# Patient Record
Sex: Female | Born: 1970 | Race: Black or African American | Hispanic: No | Smoking: Current some day smoker
Health system: Southern US, Community
[De-identification: ages and names within clinical notes are randomized; demographics above are authoritative.]

## PROBLEM LIST (undated history)

## (undated) DIAGNOSIS — O24419 Gestational diabetes mellitus in pregnancy, unspecified control: Secondary | ICD-10-CM

## (undated) HISTORY — DX: Gestational diabetes mellitus in pregnancy, unspecified control: O24.419

## (undated) HISTORY — PX: INDUCED ABORTION: SHX677

---

## 1997-06-25 ENCOUNTER — Emergency Department (HOSPITAL_COMMUNITY): Admission: EM | Admit: 1997-06-25 | Discharge: 1997-06-25 | Payer: Self-pay | Admitting: Emergency Medicine

## 1997-07-01 ENCOUNTER — Emergency Department (HOSPITAL_COMMUNITY): Admission: EM | Admit: 1997-07-01 | Discharge: 1997-07-01 | Payer: Self-pay | Admitting: Emergency Medicine

## 1997-10-30 ENCOUNTER — Emergency Department (HOSPITAL_COMMUNITY): Admission: EM | Admit: 1997-10-30 | Discharge: 1997-10-30 | Payer: Self-pay | Admitting: Emergency Medicine

## 1998-05-05 ENCOUNTER — Emergency Department (HOSPITAL_COMMUNITY): Admission: EM | Admit: 1998-05-05 | Discharge: 1998-05-05 | Payer: Self-pay | Admitting: Emergency Medicine

## 1998-05-05 ENCOUNTER — Encounter: Payer: Self-pay | Admitting: Emergency Medicine

## 1998-06-03 ENCOUNTER — Emergency Department (HOSPITAL_COMMUNITY): Admission: EM | Admit: 1998-06-03 | Discharge: 1998-06-03 | Payer: Self-pay | Admitting: Emergency Medicine

## 1998-06-21 ENCOUNTER — Emergency Department (HOSPITAL_COMMUNITY): Admission: EM | Admit: 1998-06-21 | Discharge: 1998-06-22 | Payer: Self-pay

## 2000-04-16 ENCOUNTER — Emergency Department (HOSPITAL_COMMUNITY): Admission: EM | Admit: 2000-04-16 | Discharge: 2000-04-16 | Payer: Self-pay

## 2000-04-28 ENCOUNTER — Emergency Department (HOSPITAL_COMMUNITY): Admission: EM | Admit: 2000-04-28 | Discharge: 2000-04-28 | Payer: Self-pay | Admitting: Emergency Medicine

## 2000-05-16 ENCOUNTER — Emergency Department (HOSPITAL_COMMUNITY): Admission: EM | Admit: 2000-05-16 | Discharge: 2000-05-16 | Payer: Self-pay | Admitting: Emergency Medicine

## 2000-07-17 ENCOUNTER — Emergency Department (HOSPITAL_COMMUNITY): Admission: EM | Admit: 2000-07-17 | Discharge: 2000-07-17 | Payer: Self-pay | Admitting: Emergency Medicine

## 2000-10-15 ENCOUNTER — Inpatient Hospital Stay (HOSPITAL_COMMUNITY): Admission: AD | Admit: 2000-10-15 | Discharge: 2000-10-15 | Payer: Self-pay | Admitting: Obstetrics & Gynecology

## 2000-11-09 ENCOUNTER — Emergency Department (HOSPITAL_COMMUNITY): Admission: EM | Admit: 2000-11-09 | Discharge: 2000-11-09 | Payer: Self-pay | Admitting: *Deleted

## 2000-11-25 ENCOUNTER — Ambulatory Visit (HOSPITAL_COMMUNITY): Admission: RE | Admit: 2000-11-25 | Discharge: 2000-11-25 | Payer: Self-pay | Admitting: *Deleted

## 2000-11-26 ENCOUNTER — Encounter: Admission: RE | Admit: 2000-11-26 | Discharge: 2000-11-26 | Payer: Self-pay | Admitting: Obstetrics

## 2000-12-02 ENCOUNTER — Inpatient Hospital Stay (HOSPITAL_COMMUNITY): Admission: AD | Admit: 2000-12-02 | Discharge: 2000-12-02 | Payer: Self-pay | Admitting: *Deleted

## 2000-12-03 ENCOUNTER — Encounter: Admission: RE | Admit: 2000-12-03 | Discharge: 2000-12-03 | Payer: Self-pay | Admitting: Obstetrics

## 2000-12-10 ENCOUNTER — Inpatient Hospital Stay (HOSPITAL_COMMUNITY): Admission: AD | Admit: 2000-12-10 | Discharge: 2000-12-10 | Payer: Self-pay | Admitting: Obstetrics & Gynecology

## 2000-12-12 ENCOUNTER — Emergency Department (HOSPITAL_COMMUNITY): Admission: EM | Admit: 2000-12-12 | Discharge: 2000-12-12 | Payer: Self-pay | Admitting: *Deleted

## 2001-01-06 ENCOUNTER — Encounter: Admission: RE | Admit: 2001-01-06 | Discharge: 2001-01-06 | Payer: Self-pay | Admitting: Obstetrics & Gynecology

## 2001-01-20 ENCOUNTER — Encounter: Admission: RE | Admit: 2001-01-20 | Discharge: 2001-01-20 | Payer: Self-pay | Admitting: Obstetrics & Gynecology

## 2001-01-21 ENCOUNTER — Encounter: Admission: RE | Admit: 2001-01-21 | Discharge: 2001-01-21 | Payer: Self-pay | Admitting: Obstetrics

## 2001-01-26 ENCOUNTER — Ambulatory Visit (HOSPITAL_COMMUNITY): Admission: RE | Admit: 2001-01-26 | Discharge: 2001-01-26 | Payer: Self-pay | Admitting: Obstetrics

## 2001-01-27 ENCOUNTER — Observation Stay (HOSPITAL_COMMUNITY): Admission: AD | Admit: 2001-01-27 | Discharge: 2001-01-28 | Payer: Self-pay | Admitting: *Deleted

## 2001-01-28 ENCOUNTER — Inpatient Hospital Stay (HOSPITAL_COMMUNITY): Admission: AD | Admit: 2001-01-28 | Discharge: 2001-01-28 | Payer: Self-pay | Admitting: Obstetrics & Gynecology

## 2001-02-03 ENCOUNTER — Encounter: Admission: RE | Admit: 2001-02-03 | Discharge: 2001-02-03 | Payer: Self-pay | Admitting: Obstetrics & Gynecology

## 2001-02-04 ENCOUNTER — Inpatient Hospital Stay (HOSPITAL_COMMUNITY): Admission: AD | Admit: 2001-02-04 | Discharge: 2001-02-04 | Payer: Self-pay | Admitting: Obstetrics

## 2001-02-08 ENCOUNTER — Encounter: Admission: RE | Admit: 2001-02-08 | Discharge: 2001-05-09 | Payer: Self-pay | Admitting: Obstetrics & Gynecology

## 2001-02-10 ENCOUNTER — Encounter (HOSPITAL_COMMUNITY): Admission: RE | Admit: 2001-02-10 | Discharge: 2001-03-12 | Payer: Self-pay | Admitting: Obstetrics & Gynecology

## 2001-02-10 ENCOUNTER — Encounter: Admission: RE | Admit: 2001-02-10 | Discharge: 2001-02-10 | Payer: Self-pay | Admitting: Obstetrics & Gynecology

## 2001-02-16 ENCOUNTER — Inpatient Hospital Stay (HOSPITAL_COMMUNITY): Admission: AD | Admit: 2001-02-16 | Discharge: 2001-02-16 | Payer: Self-pay | Admitting: *Deleted

## 2001-02-18 ENCOUNTER — Ambulatory Visit (HOSPITAL_COMMUNITY): Admission: RE | Admit: 2001-02-18 | Discharge: 2001-02-18 | Payer: Self-pay | Admitting: *Deleted

## 2001-02-18 ENCOUNTER — Encounter: Payer: Self-pay | Admitting: *Deleted

## 2001-02-21 ENCOUNTER — Observation Stay (HOSPITAL_COMMUNITY): Admission: AD | Admit: 2001-02-21 | Discharge: 2001-02-22 | Payer: Self-pay | Admitting: Obstetrics & Gynecology

## 2001-02-24 ENCOUNTER — Encounter: Admission: RE | Admit: 2001-02-24 | Discharge: 2001-02-24 | Payer: Self-pay | Admitting: Obstetrics & Gynecology

## 2001-03-04 ENCOUNTER — Encounter: Admission: RE | Admit: 2001-03-04 | Discharge: 2001-03-04 | Payer: Self-pay | Admitting: Obstetrics

## 2001-03-17 ENCOUNTER — Encounter (HOSPITAL_COMMUNITY): Admission: RE | Admit: 2001-03-17 | Discharge: 2001-03-30 | Payer: Self-pay | Admitting: Obstetrics & Gynecology

## 2001-03-17 ENCOUNTER — Encounter: Admission: RE | Admit: 2001-03-17 | Discharge: 2001-03-17 | Payer: Self-pay | Admitting: Obstetrics & Gynecology

## 2001-03-19 ENCOUNTER — Observation Stay (HOSPITAL_COMMUNITY): Admission: AD | Admit: 2001-03-19 | Discharge: 2001-03-20 | Payer: Self-pay | Admitting: Obstetrics & Gynecology

## 2001-03-20 ENCOUNTER — Encounter: Payer: Self-pay | Admitting: *Deleted

## 2001-03-24 ENCOUNTER — Encounter: Admission: RE | Admit: 2001-03-24 | Discharge: 2001-03-24 | Payer: Self-pay | Admitting: Obstetrics & Gynecology

## 2001-03-29 ENCOUNTER — Inpatient Hospital Stay (HOSPITAL_COMMUNITY): Admission: AD | Admit: 2001-03-29 | Discharge: 2001-04-02 | Payer: Self-pay | Admitting: Obstetrics

## 2001-05-08 ENCOUNTER — Encounter (INDEPENDENT_AMBULATORY_CARE_PROVIDER_SITE_OTHER): Payer: Self-pay | Admitting: *Deleted

## 2001-05-08 LAB — CONVERTED CEMR LAB

## 2001-09-14 ENCOUNTER — Encounter: Admission: RE | Admit: 2001-09-14 | Discharge: 2001-09-14 | Payer: Self-pay | Admitting: Sports Medicine

## 2001-09-21 ENCOUNTER — Encounter: Admission: RE | Admit: 2001-09-21 | Discharge: 2001-09-21 | Payer: Self-pay | Admitting: Family Medicine

## 2001-12-21 ENCOUNTER — Encounter: Admission: RE | Admit: 2001-12-21 | Discharge: 2001-12-21 | Payer: Self-pay | Admitting: Sports Medicine

## 2002-03-21 ENCOUNTER — Encounter: Admission: RE | Admit: 2002-03-21 | Discharge: 2002-03-21 | Payer: Self-pay | Admitting: Family Medicine

## 2002-06-20 ENCOUNTER — Encounter: Admission: RE | Admit: 2002-06-20 | Discharge: 2002-06-20 | Payer: Self-pay | Admitting: Sports Medicine

## 2002-07-16 ENCOUNTER — Emergency Department (HOSPITAL_COMMUNITY): Admission: EM | Admit: 2002-07-16 | Discharge: 2002-07-16 | Payer: Self-pay | Admitting: Emergency Medicine

## 2002-07-28 ENCOUNTER — Emergency Department (HOSPITAL_COMMUNITY): Admission: EM | Admit: 2002-07-28 | Discharge: 2002-07-28 | Payer: Self-pay | Admitting: Emergency Medicine

## 2002-09-19 ENCOUNTER — Encounter: Admission: RE | Admit: 2002-09-19 | Discharge: 2002-09-19 | Payer: Self-pay | Admitting: Family Medicine

## 2002-12-05 ENCOUNTER — Emergency Department (HOSPITAL_COMMUNITY): Admission: EM | Admit: 2002-12-05 | Discharge: 2002-12-05 | Payer: Self-pay | Admitting: Emergency Medicine

## 2002-12-08 ENCOUNTER — Encounter: Admission: RE | Admit: 2002-12-08 | Discharge: 2002-12-08 | Payer: Self-pay | Admitting: Family Medicine

## 2003-01-10 ENCOUNTER — Emergency Department (HOSPITAL_COMMUNITY): Admission: EM | Admit: 2003-01-10 | Discharge: 2003-01-10 | Payer: Self-pay | Admitting: Emergency Medicine

## 2003-01-26 ENCOUNTER — Other Ambulatory Visit: Admission: RE | Admit: 2003-01-26 | Discharge: 2003-01-26 | Payer: Self-pay | Admitting: Family Medicine

## 2003-01-26 ENCOUNTER — Encounter: Admission: RE | Admit: 2003-01-26 | Discharge: 2003-01-26 | Payer: Self-pay | Admitting: Family Medicine

## 2003-02-22 ENCOUNTER — Encounter: Admission: RE | Admit: 2003-02-22 | Discharge: 2003-02-22 | Payer: Self-pay | Admitting: Sports Medicine

## 2003-05-23 ENCOUNTER — Encounter: Admission: RE | Admit: 2003-05-23 | Discharge: 2003-05-23 | Payer: Self-pay | Admitting: Family Medicine

## 2003-05-28 ENCOUNTER — Emergency Department (HOSPITAL_COMMUNITY): Admission: EM | Admit: 2003-05-28 | Discharge: 2003-05-28 | Payer: Self-pay | Admitting: Emergency Medicine

## 2003-06-08 ENCOUNTER — Emergency Department (HOSPITAL_COMMUNITY): Admission: EM | Admit: 2003-06-08 | Discharge: 2003-06-08 | Payer: Self-pay | Admitting: Emergency Medicine

## 2003-07-06 ENCOUNTER — Encounter: Admission: RE | Admit: 2003-07-06 | Discharge: 2003-07-06 | Payer: Self-pay | Admitting: Sports Medicine

## 2003-08-22 ENCOUNTER — Encounter: Admission: RE | Admit: 2003-08-22 | Discharge: 2003-08-22 | Payer: Self-pay | Admitting: Family Medicine

## 2004-04-04 ENCOUNTER — Emergency Department (HOSPITAL_COMMUNITY): Admission: EM | Admit: 2004-04-04 | Discharge: 2004-04-04 | Payer: Self-pay | Admitting: Family Medicine

## 2004-04-09 ENCOUNTER — Ambulatory Visit: Payer: Self-pay | Admitting: Family Medicine

## 2004-05-07 ENCOUNTER — Ambulatory Visit: Payer: Self-pay | Admitting: Sports Medicine

## 2004-05-10 ENCOUNTER — Emergency Department (HOSPITAL_COMMUNITY): Admission: EM | Admit: 2004-05-10 | Discharge: 2004-05-10 | Payer: Self-pay | Admitting: Emergency Medicine

## 2004-06-26 ENCOUNTER — Ambulatory Visit: Payer: Self-pay | Admitting: Family Medicine

## 2004-08-09 ENCOUNTER — Emergency Department (HOSPITAL_COMMUNITY): Admission: EM | Admit: 2004-08-09 | Discharge: 2004-08-09 | Payer: Self-pay | Admitting: Family Medicine

## 2005-07-30 ENCOUNTER — Other Ambulatory Visit: Admission: RE | Admit: 2005-07-30 | Discharge: 2005-07-30 | Payer: Self-pay | Admitting: Family Medicine

## 2005-07-30 ENCOUNTER — Ambulatory Visit: Payer: Self-pay | Admitting: Family Medicine

## 2006-01-06 ENCOUNTER — Ambulatory Visit: Payer: Self-pay | Admitting: Family Medicine

## 2006-05-07 DIAGNOSIS — J45909 Unspecified asthma, uncomplicated: Secondary | ICD-10-CM | POA: Insufficient documentation

## 2006-05-08 ENCOUNTER — Encounter (INDEPENDENT_AMBULATORY_CARE_PROVIDER_SITE_OTHER): Payer: Self-pay | Admitting: *Deleted

## 2006-05-26 ENCOUNTER — Ambulatory Visit: Payer: Self-pay | Admitting: Family Medicine

## 2006-05-26 LAB — CONVERTED CEMR LAB
Beta hcg, urine, semiquantitative: NEGATIVE
Blood in Urine, dipstick: NEGATIVE
Glucose, Urine, Semiquant: NEGATIVE
Nitrite: NEGATIVE
Specific Gravity, Urine: 1.025
Urobilinogen, UA: 1
WBC Urine, dipstick: NEGATIVE
pH: 6

## 2006-06-03 ENCOUNTER — Emergency Department (HOSPITAL_COMMUNITY): Admission: EM | Admit: 2006-06-03 | Discharge: 2006-06-03 | Payer: Self-pay | Admitting: Emergency Medicine

## 2006-06-23 ENCOUNTER — Telehealth: Payer: Self-pay | Admitting: *Deleted

## 2006-07-14 ENCOUNTER — Ambulatory Visit: Payer: Self-pay | Admitting: Family Medicine

## 2006-07-15 ENCOUNTER — Encounter (INDEPENDENT_AMBULATORY_CARE_PROVIDER_SITE_OTHER): Payer: Self-pay | Admitting: *Deleted

## 2006-08-21 ENCOUNTER — Ambulatory Visit: Payer: Self-pay | Admitting: Family Medicine

## 2006-09-30 ENCOUNTER — Encounter (INDEPENDENT_AMBULATORY_CARE_PROVIDER_SITE_OTHER): Payer: Self-pay | Admitting: *Deleted

## 2006-12-10 ENCOUNTER — Encounter: Payer: Self-pay | Admitting: *Deleted

## 2006-12-18 ENCOUNTER — Encounter: Payer: Self-pay | Admitting: Family Medicine

## 2006-12-20 ENCOUNTER — Encounter: Payer: Self-pay | Admitting: Family Medicine

## 2007-02-20 ENCOUNTER — Emergency Department (HOSPITAL_COMMUNITY): Admission: EM | Admit: 2007-02-20 | Discharge: 2007-02-20 | Payer: Self-pay | Admitting: Emergency Medicine

## 2007-04-14 ENCOUNTER — Ambulatory Visit: Payer: Self-pay | Admitting: Family Medicine

## 2007-04-14 ENCOUNTER — Encounter (INDEPENDENT_AMBULATORY_CARE_PROVIDER_SITE_OTHER): Payer: Self-pay | Admitting: Family Medicine

## 2007-04-14 LAB — CONVERTED CEMR LAB
Beta hcg, urine, semiquantitative: NEGATIVE
Bilirubin Urine: NEGATIVE
Glucose, Urine, Semiquant: NEGATIVE
Ketones, urine, test strip: NEGATIVE
Nitrite: NEGATIVE
Protein, U semiquant: NEGATIVE
Specific Gravity, Urine: 1.02
Urobilinogen, UA: 0.2
WBC Urine, dipstick: NEGATIVE
pH: 5.5

## 2007-04-28 ENCOUNTER — Ambulatory Visit: Payer: Self-pay | Admitting: Family Medicine

## 2007-04-28 LAB — CONVERTED CEMR LAB: Beta hcg, urine, semiquantitative: NEGATIVE

## 2007-08-17 ENCOUNTER — Ambulatory Visit: Payer: Self-pay | Admitting: Family Medicine

## 2007-08-17 ENCOUNTER — Telehealth: Payer: Self-pay | Admitting: *Deleted

## 2007-10-18 ENCOUNTER — Emergency Department (HOSPITAL_COMMUNITY): Admission: EM | Admit: 2007-10-18 | Discharge: 2007-10-18 | Payer: Self-pay | Admitting: Emergency Medicine

## 2008-02-02 ENCOUNTER — Telehealth: Payer: Self-pay | Admitting: *Deleted

## 2008-02-09 ENCOUNTER — Ambulatory Visit: Payer: Self-pay | Admitting: Family Medicine

## 2008-02-09 ENCOUNTER — Telehealth: Payer: Self-pay | Admitting: *Deleted

## 2008-05-24 ENCOUNTER — Telehealth (INDEPENDENT_AMBULATORY_CARE_PROVIDER_SITE_OTHER): Payer: Self-pay | Admitting: *Deleted

## 2008-05-31 ENCOUNTER — Other Ambulatory Visit: Admission: RE | Admit: 2008-05-31 | Discharge: 2008-05-31 | Payer: Self-pay | Admitting: Family Medicine

## 2008-05-31 ENCOUNTER — Encounter: Payer: Self-pay | Admitting: Family Medicine

## 2008-05-31 ENCOUNTER — Ambulatory Visit: Payer: Self-pay | Admitting: Family Medicine

## 2008-05-31 LAB — CONVERTED CEMR LAB: Beta hcg, urine, semiquantitative: NEGATIVE

## 2008-06-01 LAB — CONVERTED CEMR LAB: Pap Smear: NORMAL

## 2008-06-02 DIAGNOSIS — R740 Nonspecific elevation of levels of transaminase and lactic acid dehydrogenase [LDH]: Secondary | ICD-10-CM

## 2008-06-02 LAB — CONVERTED CEMR LAB
ALT: 41 units/L — ABNORMAL HIGH (ref 0–35)
AST: 53 units/L — ABNORMAL HIGH (ref 0–37)
Albumin: 4.1 g/dL (ref 3.5–5.2)
Alkaline Phosphatase: 86 units/L (ref 39–117)
BUN: 12 mg/dL (ref 6–23)
CO2: 26 meq/L (ref 19–32)
Calcium: 9.4 mg/dL (ref 8.4–10.5)
Chloride: 106 meq/L (ref 96–112)
Creatinine, Ser: 1.08 mg/dL (ref 0.40–1.20)
Direct LDL: 75 mg/dL
Glucose, Bld: 77 mg/dL (ref 70–99)
Potassium: 4 meq/L (ref 3.5–5.3)
Sodium: 143 meq/L (ref 135–145)
Total Bilirubin: 0.7 mg/dL (ref 0.3–1.2)
Total Protein: 7.4 g/dL (ref 6.0–8.3)

## 2008-06-14 ENCOUNTER — Ambulatory Visit: Payer: Self-pay | Admitting: Family Medicine

## 2008-06-14 LAB — CONVERTED CEMR LAB: Beta hcg, urine, semiquantitative: NEGATIVE

## 2008-09-13 ENCOUNTER — Ambulatory Visit: Payer: Self-pay | Admitting: Family Medicine

## 2008-11-18 ENCOUNTER — Encounter (INDEPENDENT_AMBULATORY_CARE_PROVIDER_SITE_OTHER): Payer: Self-pay | Admitting: *Deleted

## 2008-11-18 DIAGNOSIS — F17200 Nicotine dependence, unspecified, uncomplicated: Secondary | ICD-10-CM

## 2008-12-12 ENCOUNTER — Ambulatory Visit: Payer: Self-pay | Admitting: Family Medicine

## 2009-06-13 ENCOUNTER — Ambulatory Visit: Payer: Self-pay | Admitting: Family Medicine

## 2009-06-13 ENCOUNTER — Telehealth: Payer: Self-pay | Admitting: Family Medicine

## 2009-06-13 ENCOUNTER — Encounter: Payer: Self-pay | Admitting: Family Medicine

## 2009-06-13 LAB — CONVERTED CEMR LAB
Beta hcg, urine, semiquantitative: NEGATIVE
Whiff Test: POSITIVE

## 2009-06-14 ENCOUNTER — Telehealth: Payer: Self-pay | Admitting: Family Medicine

## 2009-06-14 ENCOUNTER — Encounter: Payer: Self-pay | Admitting: Family Medicine

## 2009-06-25 ENCOUNTER — Encounter: Payer: Self-pay | Admitting: Family Medicine

## 2009-06-25 ENCOUNTER — Ambulatory Visit: Payer: Self-pay | Admitting: Family Medicine

## 2009-06-26 ENCOUNTER — Encounter: Payer: Self-pay | Admitting: Family Medicine

## 2009-06-26 LAB — CONVERTED CEMR LAB
ALT: 44 units/L — ABNORMAL HIGH (ref 0–35)
AST: 55 units/L — ABNORMAL HIGH (ref 0–37)
Albumin: 4.2 g/dL (ref 3.5–5.2)
CO2: 25 meq/L (ref 19–32)
Calcium: 9.4 mg/dL (ref 8.4–10.5)
Chloride: 103 meq/L (ref 96–112)
Creatinine, Ser: 1.02 mg/dL (ref 0.40–1.20)
HCT: 44.5 % (ref 36.0–46.0)
Hemoglobin: 14.5 g/dL (ref 12.0–15.0)
MCHC: 32.6 g/dL (ref 30.0–36.0)
MCV: 90.4 fL (ref 78.0–100.0)
Potassium: 4.1 meq/L (ref 3.5–5.3)
RBC: 4.92 M/uL (ref 3.87–5.11)
Total Protein: 7.7 g/dL (ref 6.0–8.3)

## 2009-06-28 ENCOUNTER — Encounter: Payer: Self-pay | Admitting: Family Medicine

## 2009-06-28 ENCOUNTER — Telehealth: Payer: Self-pay | Admitting: Family Medicine

## 2009-06-28 LAB — CONVERTED CEMR LAB: FSH: 7.5 milliintl units/mL

## 2009-07-12 ENCOUNTER — Telehealth: Payer: Self-pay | Admitting: Family Medicine

## 2009-08-03 ENCOUNTER — Encounter: Payer: Self-pay | Admitting: Family Medicine

## 2009-09-05 ENCOUNTER — Telehealth: Payer: Self-pay | Admitting: Family Medicine

## 2009-09-24 ENCOUNTER — Ambulatory Visit: Payer: Self-pay | Admitting: Family Medicine

## 2009-12-09 ENCOUNTER — Emergency Department (HOSPITAL_COMMUNITY): Admission: EM | Admit: 2009-12-09 | Discharge: 2009-12-09 | Payer: Self-pay | Admitting: Emergency Medicine

## 2010-01-16 ENCOUNTER — Encounter: Payer: Self-pay | Admitting: Family Medicine

## 2010-02-07 ENCOUNTER — Telehealth: Payer: Self-pay | Admitting: Family Medicine

## 2010-02-08 ENCOUNTER — Telehealth: Payer: Self-pay | Admitting: *Deleted

## 2010-04-11 NOTE — Progress Notes (Signed)
Summary: needs meds  Phone Note Call from Patient Call back at [phone]   Caller: Patient Summary of Call: is having sleep study on 5/24 and needs something called in to help her sleep. [facility] [address] Initial call taken by: De Nurse,  June 28, 2009 10:02 AM    The sleep study is to evaluate patient's sleep pattern to determine if it is normal.  It is Stege done under normal conditions, without a sleep aid.  This would give [place] most accurate result.  Until then patient can use good sleep hygiene.  Ashley Bibian MD  June 28, 2009 10:21 AM   told her above & gave advice on good sleep hygiene. . normal bedtime is 8:30 to 9:30. gets up at 6. advised going to bed at 9:30. she may be trying to get more than her body needs. told her no murder mysteries before bed. no tv or kids in room. just sex & sleep. cool temp & dark room. she is willing to try this & will let us know after the sleep study how she is doing. Marland KitchenGolden Circle RN  June 28, 2009 10:54 AM  Thank you! Ashley Awbrey MD  June 28, 2009 11:05 AM

## 2010-04-11 NOTE — Progress Notes (Signed)
Summary: Rx Req  Phone Note Refill Request Call back at [phone] Message from:  Patient  Refills Requested: Medication #1:  VALTREX 1 GM TABS 1 tab by mouth two times a day for 10 days [facility] [address]  Initial call taken by: Clydell Hakim,  September 05, 2009 2:38 PM    Was prescribed by Dr Constance Goltz in April.  If pt is having another outbreak, I would like to see her.  Tymira Horkey MD  September 05, 2009 2:45 PM

## 2010-04-11 NOTE — Progress Notes (Signed)
Summary: Lab results  Phone Note Outgoing Call   Call placed by: Ronav Furney MD,  Jul 12, 2009 4:07 PM Call placed to: Patient Summary of Call: Called pt about lab results.  Left message.  Thyroid test a little low, but still ok.  Would like to retest in July.  Pt should reschedule appt in July.   Initial call taken by: Esther Bradstreet MD,  Jul 12, 2009 4:08 PM

## 2010-04-11 NOTE — Letter (Signed)
Summary: Lab Results  [facility]  [address]   [place], [place] [address]   Phone: [phone]  Fax: [phone]    06/14/2009  [facility] [address] [place], [place]  [address]  Dear Ms. Rekowski,  I tried to reach you by phone, but there was no answer.  I have reviewed your lab results from yesterday, and they are as follows:   - Gonorrhea -- negative  - Chlamydia -- negative  - HIV -- negative  - Syphilis -- negative  - Herpes, Type 2 -- positive  The fact that your Herpes, Type 2, antibody test was positive indicates either that you have had a herpes infection in the past or are having one now, I cannot tell from the test.  Either way, your symptoms are significant enough that we should go ahead and treat you.  Enclosed, please find a prescription for Valacyclovir, which is an anti-herpes medicine.  Please take this two times a day for the next 10 days.  Sincerely,   Romero Belling MD  Appended Document: Lab Results Letter and Valacyclovir prescription placed in envelope and placed in outgoing mail.

## 2010-04-11 NOTE — Assessment & Plan Note (Signed)
Summary: vaginal burning   Vital Signs:  Patient profile:   40 year old female Height:      64 inches Weight:      154 pounds BMI:     26.53 Temp:     97.5 degrees F Pulse rate:   85 / minute BP sitting:   130 / 89  Vitals Entered By: Golden Circle RN (June 13, 2009 1:31 PM)  Primary Care Provider:  Cat Ta MD  CC:  vagina burning.  History of Present Illness: 40 yo female with 1 week of burning, tingling (not pain) superficially on the vulva bilateral and above vagina.  Started as pruritic bump on sacrum and spread to pubic hair area (which she shaves) and vulva.  Has bumps in area of pubic hair, none on vulva.  Also with thick vaginal discharge x1 day and suprapubic pain.  One sexual partner x4 years.  Partner without STD sypmtoms, but did have cold sore 2 weeks ago.  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  Denies fever.  Taking no medicines for this.  Is taking Benadryl for allergies.  Requests refills of allergy meds.  Habits & Providers  Alcohol-Tobacco-Diet     Alcohol drinks/day: 1     Tobacco Status: current     Tobacco Counseling: to quit use of tobacco products     Cigarette Packs/Day: <0.25  Exercise-Depression-Behavior     STD Risk: current     Drug Use: past     Seat Belt Use: always  Allergies (verified): 1)  ! Septra  Social History: Packs/Day:  <0.25 Seat Belt Use:  always Drug Use:  past STD Risk:  current  Review of Systems       Per HPI.  Physical Exam  General:  Well-developed,well-nourished,in no acute distress; alert,appropriate and cooperative throughout examination, anxious Abdomen:  Bowel sounds positive,abdomen soft and non-tender without masses, organomegaly or hernias noted. Genitalia:  Normal introitus for age, no external lesions, thick white vaginal discharge, mucosa pink and moist, no vaginal or cervical lesions, no vaginal atrophy, no friaility or hemorrhage, normal uterus size and position, no adnexal  masses or tenderness Skin:  Multiple papules or pustules in pubic hair (shaved), one excorated 3 mm scab on sacrum.   Impression & Recommendations:  Problem # 1:  VAGINAL PRURITUS (ICD-698.1) Assessment New Yeast infection and BV on wet prep:  treat with Floconazole x1, then Metronidazole 500 mg by mouth two times a day x1 week, then repeat Fluconazole x1. 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  Problem # 2:  SUPRAPUBIC PAIN (ICD-789.09) Assessment: New Likely associated with #1, above.  Check UPT--negative.  Complete Medication List: 1)  Albuterol 90 Mcg/act Aers (Albuterol) .... Inhale 2 puff using inhaler every four hours as needed wheeze/shortness of breath 2)  Vistaril 25 Mg Caps (Hydroxyzine pamoate) .... Take 1 capsule by mouth at bedtime for allergic itching 3)  Singulair 10 Mg Tabs (Montelukast sodium) .... One tab by mouth qday 4)  Advair Diskus 250-50 Mcg/dose Misc (Fluticasone-salmeterol) .... Inhaled bid 5)  Flonase 50 Mcg/act Susp (Fluticasone propionate) .... 2 sprays per nostril once daily.  disp qs x1 month 6)  Metronidazole 500 Mg Tabs (Metronidazole) .Marland Kitchen.. 1 tab by mouth two times a day x7 days 7)  Fluconazole 150 Mg Tabs (Fluconazole) .Marland Kitchen.. 1 tab by mouth today, repeat in 1 week  Other Orders: Wet Prep- [facility] (  [address]) GC/Chlamydia-[facility] (87591/87491) HIV-[facility] [phone]) RPR-[facility] [phone]) HSV 2- [facility] (13086-57846) U Preg-[facility] (81025) [facility]- Est Level  3 ([address]295)  Patient Instructions: 1)  Pregnancy test negative. 2)  Yeast infection, bacterial infection of vagina--neither of these are sexually transmitted.  I'll let you know the results of other tests. Prescriptions: FLUCONAZOLE 150 MG TABS (FLUCONAZOLE) 1 tab by mouth today, repeat in 1 week  #2 x 0   Entered and Authorized by:   Romero Belling MD   Signed by:   Romero Belling MD on 06/13/2009   Method used:   Print then  Give to Patient   RxID:   [id number] METRONIDAZOLE 500 MG TABS (METRONIDAZOLE) 1 tab by mouth two times a day x7 days  #14 x 0   Entered and Authorized by:   Romero Belling MD   Signed by:   Romero Belling MD on 06/13/2009   Method used:   Print then Give to Patient   RxID:   [id number] FLONASE 50 MCG/ACT  SUSP (FLUTICASONE PROPIONATE) 2 sprays per nostril once daily.  Disp qs x1 month  #1 x 0   Entered and Authorized by:   Romero Belling MD   Signed by:   Romero Belling MD on 06/13/2009   Method used:   Print then Give to Patient   RxID:   [id number] ADVAIR DISKUS 250-50 MCG/DOSE  MISC (FLUTICASONE-SALMETEROL) inhaled bid  #60 x 0   Entered and Authorized by:   Romero Belling MD   Signed by:   Romero Belling MD on 06/13/2009   Method used:   Print then Give to Patient   RxID:   [id number] VISTARIL 25 MG CAPS (HYDROXYZINE PAMOATE) Take 1 capsule by mouth at bedtime for allergic itching  #30 x 0   Entered and Authorized by:   Romero Belling MD   Signed by:   Romero Belling MD on 06/13/2009   Method used:   Print then Give to Patient   RxID:   [id number] ALBUTEROL 90 MCG/ACT AERS (ALBUTEROL) Inhale 2 puff using inhaler every four hours as needed wheeze/shortness of breath  #1 x 0   Entered and Authorized by:   Romero Belling MD   Signed by:   Romero Belling MD on 06/13/2009   Method used:   Print then Give to Patient   RxID:   [id number]   Laboratory Results   Urine Tests  Date/Time Received: June 13, 2009 2:07 PM  Date/Time Reported: June 13, 2009 2:20 PM     Urine HCG: negative Comments: ...............test performed by......Marland KitchenBonnie A. [place], MLS (ASCP)cm  Date/Time Received: June 13, 2009 2:00 PM  Date/Time Reported: June 13, 2009 2:23 PM   [employer] Source: vag WBC/hpf: 5-10 Bacteria/hpf: 3+  Cocci Clue cells/hpf: many  Positive whiff Yeast/hpf: few Trichomonas/hpf: none Comments: ...............test performed by......Marland KitchenBonnie  A. [place], MLS (ASCP)cm

## 2010-04-11 NOTE — Assessment & Plan Note (Signed)
Summary: depo,df  Nurse Visit   Allergies: 1)  ! Septra  Medication Administration  Injection # 1:    Medication: Depo-Provera 150mg     Diagnosis: CONTRACEPTIVE MANAGEMENT (ICD-V25.09)    Route: IM    Site: LUOQ gluteus    Exp Date: 04/2012    Lot #: [id number]    Mfr: [email or web address]    Comments: next Depo due Oct 3 thru Dec 24, 2009    Patient tolerated injection without complications    Given by: Theresia Lo RN (September 24, 2009 2:22 PM)  Orders Added: 1)  Depo-Provera 150mg  [J1055] 2)  Admin of Injection (IM/SQ) [81191]   Medication Administration  Injection # 1:    Medication: Depo-Provera 150mg     Diagnosis: CONTRACEPTIVE MANAGEMENT (ICD-V25.09)    Route: IM    Site: LUOQ gluteus    Exp Date: 04/2012    Lot #: [id number]    Mfr: [email or web address]    Comments: next Depo due Oct 3 thru Dec 24, 2009    Patient tolerated injection without complications    Given by: Theresia Lo RN (September 24, 2009 2:22 PM)  Orders Added: 1)  Depo-Provera 150mg  [J1055] 2)  Admin of Injection (IM/SQ) [62130]    patient inquired about Rx for Valtrex that she requested a few weeks ago. Explained that Dr. Janalyn Harder had stated that she will need to be seen before and Rx can be given again. offered [place] appointment tomorrow but she cannot come in tomorrow. would like to come Wednesday. she will call back tomorrow to make appoitment for Wednesday.  condoms given at patient's request. Theresia Lo RN  September 24, 2009 2:26 PM

## 2010-04-11 NOTE — Assessment & Plan Note (Signed)
Summary: cpe/pap,tcb   Vital Signs:  Patient profile:   39 year old female Height:      64 inches Weight:      156 pounds BMI:     26.87 Temp:     98.3 degrees F oral Pulse rate:   86 / minute BP sitting:   119 / 83  (right arm) Cuff size:   regular  Vitals Entered By: Tessie Fass CMA (June 25, 2009 2:28 PM) CC: f/u astham Is Patient Diabetic? No Pain Assessment Patient in pain? no        Primary Care Provider:  Faithanne Verret MD  CC:  f/u astham.  History of Present Illness: 40 y/o F here for  Asthma x 4-5 days: nonproductive cough.  Using albuterol inhaler every morning, then 3 more times at night.  +dyspnea after walking uphill to catch the bus.  No fever, chills.  +HA (little bit).  +wheezing.  +nausea.  -vomiting.  Smoking socially when she is around other people.  No one smokes in her house.  Cough is keeping her up.  +Herpes:  Was in sexual relationship with boyfriend for 4 yrs.  She has since broken up with him.  They had plans to marry.  She just picked up Rx for Valtrex, will continue for total 10 days.  Discussed outbreak care.  Family Planning:  on Depo, but has shot was more than 3 months ago.  LMP 04/2009.    Daytime sleepiness:  fall asleep during day if not doing anything.  +snores.  tired during day.  When she wakes up she feels like she did not sleep at all.  Wakes up from sleep because she stops breathing.    FATIGUE Onset: 3-4 months Description: daytime sleepiness, feeling tired all the time.  Fatigue with exertion: sometimes Physical limitations: none   Symptoms Fever: no Night sweats: no Weight loss:  no Exertional chest pain: no Dyspnea: yes Cough: yes Hemoptysis: no New medications: no Leg swelling: no Orthopnea: no PND: no Melena: no Adenopathy: no Severe snoring: yes Daytime sleepiness: yes Skin changes: no Feeling depressed: no Anhedonia: no Altered appetite: no Poor sleep: yes    Habits & Providers  Alcohol-Tobacco-Diet   Tobacco Status: current     Tobacco Counseling: to quit use of tobacco products     Cigarette Packs/Day: <0.25  Current Medications (verified): 1)  Albuterol 90 Mcg/act Aers (Albuterol) .... Inhale 2 Puff Using Inhaler Every Four Hours As Needed Wheeze/shortness of Breath 2)  Vistaril 25 Mg Caps (Hydroxyzine Pamoate) .... Take 1 Capsule By Mouth At Bedtime For Allergic Itching 3)  Advair Diskus 250-50 Mcg/dose  Misc (Fluticasone-Salmeterol) .... Inhaled Bid 4)  Flonase 50 Mcg/act  Susp (Fluticasone Propionate) .... 2 Sprays Per Nostril Once Daily.  Disp Qs X1 Month 5)  Valtrex 1 Gm Tabs (Valacyclovir Hcl) .Marland Kitchen.. 1 Tab By Mouth Two Times A Day For 10 Days 6)  Zyrtec Allergy 10 Mg Caps (Cetirizine Hcl) .Marland Kitchen.. 1 Cap By Mouth Daily 7)  Diphenhydramine Hcl 25 Mg Caps (Diphenhydramine Hcl) .Marland Kitchen.. 1 - 2 Cap By Mouth Two Times A Day As Needed Cough  Allergies (verified): 1)  ! 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 No apparent alopecia or balding. Ears:  External ear exam shows no significant lesions or deformities.  Otoscopic examination reveals clear canals, tympanic membranes are intact bilaterally without bulging, retraction, inflammation or discharge. Hearing is grossly normal bilaterally. Nose:  External nasal examination shows no deformity or inflammation. Nasal mucosa are pink and moist without lesions or exudates. Mouth:  Oral mucosa and oropharynx without lesions or exudates.  Teeth in good repair. Neck:  No deformities, masses, or tenderness noted. Lungs:  Normal respiratory effort, chest expands symmetrically. Lungs are  clear to auscultation, no crackles or wheezes. Heart:  Normal rate and regular rhythm. S1 and S2 normal without gallop, murmur, click, rub or other extra sounds. Abdomen:  Bowel sounds positive,abdomen soft and non-tender without masses, organomegaly or hernias noted. Msk:  No deformity or scoliosis noted of thoracic or lumbar spine.   Pulses:  R and L carotid,radial,l pulses are full and equal bilaterally Extremities:  No clubbing, cyanosis, edema, or deformity noted with normal full range of motion of all joints.   Neurologic:  No cranial nerve deficits noted. Station and gait are normal. Plantar reflexes are down-going bilaterally. DTRs are symmetrical throughout. Sensory, motor and coordinative functions appear intact. Skin:  Intact without suspicious lesions or rashes Cervical Nodes:  No lymphadenopathy noted Axillary Nodes:  No palpable lymphadenopathy   Impression & Recommendations:  Problem # 1:  HYPERSOMNIA (ICD-780.54) Assessment New Complaints may be sleep apnea in nature.  Will refer for sleep study.  Will get TSH, bmet. Orders: CBC-[facility] (13244) Comp Met-[facility] (01027-25366)  Problem # 2:  FATIGUE (ICD-780.79) Assessment: New Related to insomnia?  Will check cbc, cmet, tsh.  Not infectious in nature.   Orders: CBC-[facility] (44034) Comp Met-[facility] (74259-56387) [facility]- Est  Level 4 (56433)  Problem # 3:  CONTRACEPTIVE MANAGEMENT (ICD-V25.09) Assessment: Unchanged If upt negative, will continue depo. Orders: U Preg-[facility] (81025) [facility]- Est  Level 4 (29518) Depo-Provera 150mg  (J1055)  Problem # 4:  ASTHMA, PERSISTENT, MODERATE (ICD-493.90) Assessment: Unchanged Lungs CTA today.  Pt states that medicaid will not pay for singulair and that it would cost her $70.  Will continue other meds.  The following medications were removed from the medication list:    Singulair 10 Mg Tabs (Montelukast sodium) ..... One tab by mouth qday Her updated medication list for this problem includes:     Albuterol 90 Mcg/act Aers (Albuterol) ..... Inhale 2 puff using inhaler every four hours as needed wheeze/shortness of breath    Advair Diskus 250-50 Mcg/dose Misc (Fluticasone-salmeterol) ..... Inhaled bid  Orders: [facility]- Est  Level 4 (84166)  Complete Medication List: 1)  Albuterol 90 Mcg/act Aers (Albuterol) .... Inhale 2 puff using inhaler every four hours as needed wheeze/shortness of breath 2)  Vistaril 25 Mg Caps (Hydroxyzine pamoate) .... Take 1 capsule by mouth at bedtime for allergic itching 3)  Advair Diskus 250-50 Mcg/dose Misc (Fluticasone-salmeterol) .... Inhaled bid 4)  Flonase 50 Mcg/act Susp (Fluticasone propionate) .... 2 sprays per nostril once daily.  disp qs x1 month 5)  Valtrex 1 Gm Tabs (Valacyclovir hcl) .Marland Kitchen.. 1 tab by mouth two times a day for 10 days 6)  Zyrtec Allergy 10 Mg Caps (Cetirizine hcl) .Marland Kitchen.. 1 cap by mouth daily 7)  Diphenhydramine Hcl 25 Mg Caps (Diphenhydramine hcl) .Marland Kitchen.. 1 - 2 cap by mouth two times a day as needed cough  Other Orders: Sleep Disorder Referral (Sleep Disorder) TSH-[facility] (06301-60109) Prescriptions: DIPHENHYDRAMINE HCL 25 MG CAPS (  DIPHENHYDRAMINE HCL) 1 - 2 cap by mouth two times a day as needed cough  #30 x 3   Entered and Authorized by:   Angeline Slim MD   Signed by:   Angeline Slim MD on 06/25/2009   Method used:   Electronically to        [id number] (retail)       [address] [place], [place]  [address]       Ph: [phone]       Fax: [phone]   RxID:   [id number] ZYRTEC ALLERGY 10 MG CAPS (CETIRIZINE HCL) 1 cap by mouth daily  #30 x 6   Entered and Authorized by:   Angeline Slim MD   Signed by:   Angeline Slim MD on 06/25/2009   Method used:   Electronically to        [id number]* (retail)       [address] [place], [place]  [address]       Ph: [phone]       Fax: [phone]   RxID:   [id number]    Prevention & Chronic Care Immunizations   Influenza vaccine: Fluvax Non-MCR   (12/12/2008)   Influenza vaccine due: Refused  (05/31/2008)    Tetanus booster: 05/31/2008: Tdap   Tetanus booster due: 03/10/2008    Pneumococcal vaccine: Not documented  Other Screening   Pap smear: Normal  (06/01/2008)   Pap smear action/deferral: Deferred-3 yr interval  (06/25/2009)   Pap smear due: 06/02/2011   Smoking status: current  (06/25/2009)  Lipids   Total Cholesterol: Not documented   LDL: Not documented   LDL Direct: 75  (05/31/2008)   HDL: Not documented   Triglycerides: Not documented  Laboratory Results   Urine Tests  Date/Time Received: June 25, 2009 3:33 PM  Date/Time Reported: June 25, 2009 3:41 PM     Urine HCG: negative Comments: ...........test performed by...........Marland KitchenTerese Door, CMA      Medication Administration  Injection # 1:    Medication: Depo-Provera 150mg     Diagnosis: CONTRACEPTIVE MANAGEMENT (ICD-V25.09)    Route: IM    Site: RUOQ gluteus    Exp Date: 01/09/2012    Lot #: [id number]    Mfr: [email or web address]    Comments: NEXT DEPO DUE 09-10-09 THROUGH 09-24-09. REMINDER CARD GIVEN TODAY.    Patient tolerated injection without complications    Given by: Arlyss Repress CMA, (June 25, 2009 4:02 PM)  Orders Added: 1)  Sleep Disorder Referral [Sleep Disorder] 2)  TSH-[facility] [32202-54270] 3)  CBC-[facility] [85027] 4)  Comp Met-[facility] [62376-28315] 5)  U Preg-[facility] [81025] 6)  [facility]- Est  Level 4 [17616] 7)  Depo-Provera 150mg  [J1055]

## 2010-04-11 NOTE — Progress Notes (Signed)
Summary: Lab Results  Phone Note Outgoing Call Call back at [facility] Phone [phone]   Call placed by: Romero Belling MD,  June 14, 2009 2:35 PM Call placed to: Patient Summary of Call: I reviewed results from yesterday.  Patient is HSV2 Ab positive, indicating past or present HSV2 infection.  Given symptoms, will initiate treatment with Valacyclovir, though unsure where to send prescription.  Reached voicemail.  Did not give information.  Asked patient to call back at [phone].  When she calls back, will send Rx for Valacyclovir 1000 mg by mouth two times a day x10 days.  Will send patient letter with Rx today as well, in case she doesn't call back.    New/Updated Medications: VALTREX 1 GM TABS (VALACYCLOVIR HCL) 1 tab by mouth two times a day for 10 days Prescriptions: VALTREX 1 GM TABS (VALACYCLOVIR HCL) 1 tab by mouth two times a day for 10 days  #20 x 0   Entered and Authorized by:   Romero Belling MD   Signed by:   Romero Belling MD on 06/14/2009   Method used:   Print then Give to Patient   RxID:   [id number]

## 2010-04-11 NOTE — Miscellaneous (Signed)
Summary: DNKA sleep study  Clinical Lists Changes rec'd fax that pt dnka her sleep study.Golden Circle RN  Aug 03, 2009 11:19 AM

## 2010-04-11 NOTE — Progress Notes (Signed)
Summary: refill  Phone Note Refill Request Message from:  Patient  Refills Requested: Medication #1:  VISTARIL 25 MG CAPS Take 1 capsule by mouth at bedtime for allergic itching Initial call taken by: De Nurse,  February 08, 2010 9:17 AM    I cannot refill this medication.  Last refill was in 06/2009 by another provider.  I have not discussed this with pt. Cat Ta MD  February 08, 2010 9:41 AM  Left message on vm, informing pt of above...............................................Marland KitchenGaren Grams LPN February 08, 2010 1:54 PM

## 2010-04-11 NOTE — Progress Notes (Signed)
Summary: wi request   Phone Note Call from Patient Call back at [phone]   Reason for Call: Talk to Nurse Summary of Call: pt is requesting wi appt this afternoon for std exposure Initial call taken by: Knox Royalty,  June 13, 2009 8:45 AM  Follow-up for Phone Call        feels like she has symptoms. cannot come in until after 12. will see Dr. Constance Goltz at 1:30. she knows she will not be seeing her pcp Follow-up by: Golden Circle RN,  June 13, 2009 8:58 AM

## 2010-04-11 NOTE — Progress Notes (Signed)
  Phone Note Call from Patient   Caller: Patient Call For: [phone] or [phone] Summary of Call: Patient is having an outbreak of her condition.  She need to know if she can get something over the counter until her appt tomorrow.  Please call her to inform her what to do.  Initial call taken by: Abundio Miu,  February 07, 2010 8:57 AM  Follow-up for Phone Call         will ask Dr. Janalyn Harder about this to make sure RX cannot be sent in today without patient being seen. . Follow-up by: Theresia Lo RN,  February 07, 2010 9:53 AM    Prescriptions: VALTREX 1 GM TABS (VALACYCLOVIR HCL) 1 tab by mouth two times a day for 10 days  #20 x 0   Entered and Authorized by:   Angeline Slim MD   Signed by:   Angeline Slim MD on 02/07/2010   Method used:   Electronically to        [id number] [address] (retail)       [address]       [place], [place]  [id number]       Ph: [phone]       Fax: [phone]   RxID:   [id number]  Rx can be sent in.  Pt does not need to come in. Cat Ta MD  February 07, 2010 10:32 AM message left on voicemail that rx has been sent to pharmacy and she does not need to come in.left message to call back if questions.  Appended Document:  meds sent to wrong pharm - needs to go to [facility] [address]

## 2010-04-11 NOTE — Miscellaneous (Signed)
Summary: Changing prob list   Clinical Lists Changes  Problems: Removed problem of FATIGUE (ICD-780.79) Removed problem of HYPERSOMNIA (ICD-780.54) Removed problem of SUPRAPUBIC PAIN (ICD-789.09) Removed problem of VAGINAL PRURITUS (ICD-698.1) Removed problem of NEED PROPHYLACTIC VACCINATION&INOCULATION FLU (ICD-V04.81) Removed problem of RHINITIS, ALLERGIC (ICD-477.9) Removed problem of SCREENING FOR ISCHEMIC HEART DISEASE (ICD-V81.0) Removed problem of AMENORRHEA (ICD-626.0) Removed problem of CONTACT OR EXPOSURE TO OTHER VIRAL DISEASES (ICD-V01.79) Removed problem of SCREENING FOR MALIGNANT NEOPLASM OF THE CERVIX (ICD-V76.2) Removed problem of CONTRACEPTIVE MANAGEMENT (ICD-V25.09) Removed problem of THYROID DISORDER (ICD-246.9)

## 2010-05-22 LAB — POCT PREGNANCY, URINE: Preg Test, Ur: NEGATIVE

## 2010-07-10 ENCOUNTER — Ambulatory Visit (INDEPENDENT_AMBULATORY_CARE_PROVIDER_SITE_OTHER): Payer: Medicaid Other | Admitting: Family Medicine

## 2010-07-10 ENCOUNTER — Encounter: Payer: Self-pay | Admitting: Family Medicine

## 2010-07-10 DIAGNOSIS — J45909 Unspecified asthma, uncomplicated: Secondary | ICD-10-CM

## 2010-07-10 DIAGNOSIS — F17200 Nicotine dependence, unspecified, uncomplicated: Secondary | ICD-10-CM

## 2010-07-10 MED ORDER — HYDROXYZINE PAMOATE 25 MG PO CAPS: ORAL_CAPSULE | ORAL | Status: DC

## 2010-07-10 MED ORDER — FLUTICASONE-SALMETEROL 250-50 MCG/DOSE IN AEPB: 1.0000 | INHALATION_SPRAY | Freq: Two times a day (BID) | RESPIRATORY_TRACT | Status: DC

## 2010-07-10 MED ORDER — ALBUTEROL 90 MCG/ACT IN AERS: 2.0000 | INHALATION_SPRAY | RESPIRATORY_TRACT | Status: DC | PRN

## 2010-07-10 MED ORDER — VALACYCLOVIR HCL 1 G PO TABS: 1000.0000 mg | ORAL_TABLET | Freq: Two times a day (BID) | ORAL | Status: DC

## 2010-07-10 MED ORDER — CETIRIZINE HCL 10 MG PO TABS: 10.0000 mg | ORAL_TABLET | Freq: Every day | ORAL | Status: DC

## 2010-07-10 MED ORDER — FLUTICASONE PROPIONATE 50 MCG/ACT NA SUSP: NASAL | Status: DC

## 2010-07-10 NOTE — Assessment & Plan Note (Signed)
Pt states compliance with Advair, zyrtec, flonase, albuterol but feeling dyspneic after walking up one flight of stairs.  She is having a lot of allergic symptoms too.  I have written to her apt complex to request a ground-floor apt.  I would like pt to f/u in Pharmacy clinic for PFT.  Pt is a nonsmoker.

## 2010-07-10 NOTE — Assessment & Plan Note (Signed)
Was an "occasional" smoker.  Has quit for years.

## 2010-07-10 NOTE — Progress Notes (Signed)
  Subjective:    Patient ID: Ashley Jordan, female    DOB: [birth year], 40 y.o.   MRN: [id number]  HPI Pt is here requesting that I write a letter to her apt complex on her behalf to request that she can be moved to an appt on the ground floor.  She is having difficult walking up a flight of stairs to get to her apt.  She states that she becomes dyspneic.  She is taking her Advair, Zyrtec, Flonase as directed. And she is using albuterol almost daily.  She denies smoking.    Review of Systems  Constitutional: Negative for fever, chills and activity change.  HENT: Positive for rhinorrhea. Negative for congestion, sore throat, mouth sores, trouble swallowing, neck stiffness and sinus pressure.   Respiratory: Positive for shortness of breath and wheezing. Negative for apnea, chest tightness and stridor.   Cardiovascular: Negative for chest pain, palpitations and leg swelling.       Objective:   Physical Exam  Constitutional: She is oriented to person, place, and time. She appears well-developed and well-nourished. No distress.  HENT:  Head: Normocephalic and atraumatic.  Neck: Normal range of motion. Neck supple.  Cardiovascular: Normal rate, regular rhythm, normal heart sounds and intact distal pulses.   No murmur heard. Pulmonary/Chest: Effort normal and breath sounds normal. No respiratory distress. She has no wheezes. She has no rales. She exhibits no tenderness.  Musculoskeletal: She exhibits no edema.  Lymphadenopathy:    She has no cervical adenopathy.  Neurological: She is alert and oriented to person, place, and time.  Skin: She is not diaphoretic.          Assessment & Plan:

## 2010-07-10 NOTE — Patient Instructions (Signed)
Please make an appointment with Pharmacy Clinic for Pulmonary Function Test.  Take all your medications as directed.

## 2010-07-19 ENCOUNTER — Ambulatory Visit: Payer: Medicaid Other | Admitting: Pharmacist

## 2010-07-23 ENCOUNTER — Other Ambulatory Visit: Payer: Self-pay | Admitting: Family Medicine

## 2010-07-23 NOTE — Telephone Encounter (Signed)
Refill request

## 2010-07-24 ENCOUNTER — Ambulatory Visit: Payer: Medicaid Other | Admitting: Family Medicine

## 2010-07-26 NOTE — Discharge Summary (Signed)
[facility]  Patient:    Ashley Jordan, Ashley Jordan Visit Number: [id number] MRN: [id number]          Service Type: OBS Location: 910B 9153 01 Attending Physician:  Tammi Sou Dictated by:   Michell Heinrich, M.D. Admit Date:  01/27/2001 Discharge Date: 01/28/2001                             Discharge Summary  ADMISSION DIAGNOSES:          1. Cervicitis.                               2. Preterm contractions.                               3. Trichomonas vaginalis.  DISCHARGE DIAGNOSES:          1. Cervicitis.                               2. Preterm contractions.                               3. Trichomonas vaginalis.  DISCHARGE MEDICATIONS:        1. Augmentin 500 mg tabs: one tab three times                                  a day for six days.                               2. Actigall 300 mg tabs: one tab twice per day.                               3. Prenatal vitamin one tab daily.  SERVICE:                      OB Teaching Service.  ATTENDING:                    Roseanna Rainbow, M.D.  RESIDENT:                     Michell Heinrich, M.D.  CONSULTS:                     None.  PROCEDURES:                   None.  HISTORY AND PHYSICAL:         For complete H&P, please see resident H&P in chart. Briefly, this was a 40 year old, G3, P0-1-1-0, who presented at [redacted] weeks gestation with complaint of bilateral inguinal crampiness which radiated down into her left leg. Onset was the morning of presentation and was associated with a small amount of pressure. The patient did have a significant past OB history of a delivery of a stillborn 32-week gestation vaginally, and phospholipid tests were all negative and this patient is [facility].  Evaluation on admission revealed  occasional uterine contractions with irritability and a cervix that was fingertip external, closed internal, thick and long,  approximately 3 cm and the baby was high. A urinalysis was negative and a wet prep showed moderate white blood cells and bacteria with few Trichomonas. The patient was afebrile and vital signs were normal. She was admitted for possible cervicitis and preterm contractions and Jordan course as thus follows.  Jordan COURSE:              The patient was admitted to Mother/Baby unit and placed on intermittent toco and external fetal monitoring and placed on IV Unasyn and IV fluids and bed rest. She had improvement in her pain a nondistended the morning after admission, said that she felt much better, and her cervical exam was 1 cm, long and thick and fetal presenting part could not be palpated. Again, she was afebrile and vital signs were normal, and the remainder of her physical exam was normal. Due to her improvement overnight and the determination that her cervical exam could be appropriate for a multiparous patient, she was discharged to home with orders for bed rest and continuation of antibiotics as previously mentioned.  DISCHARGE FOLLOWUP:           She was given preterm labor precautions and followup was to be at [facility] on February 03, 2001 at her usual appointment time. Dictated by:   Michell Heinrich, M.D. Attending Physician:  Tammi Sou DD:  01/28/01 TD:  01/28/01 Job: [phone] [id number]

## 2010-07-26 NOTE — Discharge Summary (Signed)
[facility]  Patient:    Ashley Jordan, Ashley Jordan Visit Number: [id number] MRN: [id number]          Service Type: OBS Location: 910A 9134 01 Attending Physician:  Tammi Sou Dictated by:   Ocie Doyne, M.D. Admit Date:  03/29/2001 Discharge Date: 04/02/2001                             Discharge Summary  ADMISSION DIAGNOSES: 1. Intrauterine pregnancy at 37-1/[redacted] weeks gestation. 2. Preeclampsia. 3. Gestational diabetes mellitus. 4. Cholestasis.  DISCHARGE DIAGNOSES: 1. Spontaneous vaginal delivery at 37-1/[redacted] weeks gestation of a viable female    infant. 2. Preeclampsia. 3. Gestational diabetes mellitus. 4. Cholestasis. 5. Anemia.  DISCHARGE MEDICATIONS: 1. Ibuprofen 600 mg p.o. q.6h. p.r.n. pain. 2. Depo-Provera administered on 04/02/01. 3. Prenatal vitamin one p.o. q.d. x2 months. 4. Ferrous sulfate 325 mg p.o. b.i.d. with meals.  HISTORY OF PRESENT ILLNESS:  This 40 year old, G3, P0-1-1-0, presented at 37-1/[redacted] weeks gestation.  She was seen for a non-stress test which was reassuring, but was noted to have an elevated blood pressure of 140/94, as well as an elevated uric acid of 8.4, and LDH of 188, borderline platelets of 151, and complaining of several days of headache.  PHYSICAL EXAMINATION:  EXTREMITIES:  Deep tendon reflexes 1+, no clonus, and some mild lower extremity edema.  PELVIC:  Her cervical examination was 2 to 3 cm, 75% effacement, and -1 station.  Fetal heart tracing was reassuring.  AST was 28, ALT was 18.  HOSPITAL COURSE:  She wad admitted for induction of labor secondary to preeclampsia, gestational diabetes, and cholestasis.  She received low dose Pitocin, and when she was actively laboring, magnesium sulfate prophylaxis was started.  She also received penicillin during labor because of group B strep positive culture.  During labor, her membranes were artificially ruptured, and she had thin meconium stained  fluid.  She received an epidural for pain, and at 2105 on the date of admission, she delivered a viable female infant by spontaneous vaginal delivery to a sterile field with Apgars of 9 and 9 at one and five minutes.  Placenta delivered spontaneously at 2108.  She was noted to have some bleeding around the gums at delivery.  A stat CBC revealed a platelet count of 159.  She was transferred to the intensive care unit for ongoing magnesium therapy.  Her insulin was discontinued on postpartum day #1, and she received no further treatment for her gestational diabetes which resolved with delivery.  On postpartum day #1, she complained of feeling light-headed and weak.  She was found to have a magnesium level of 6.9, uric acid 7.5, and LDH of 242, with platelets of 151.  Her magnesium dose was titrated down to achieve a therapeutic level, and on postpartum day #3, her magnesium was discontinued.  At that time, her fluid balance showed a net diuresis of 2.5 L over the previous 24 hours.  Her labs had not normalized. She had an uric acid of 7.8, and LDH of 268, but her platelet count was 240. Her blood pressure was within normal limits.  She was diuresing well, and she was felt to be at low risk for complications of preeclampsia.  Her postpartum course was otherwise unremarkable.  At the time of discharge, her hemoglobin was 6.6, this was felt to be due to uterine atony following delivery secondary to use of magnesium sulfate, and also to  chronic anemia.  She will be discharged home on iron supplementation which she will continue over the next two months.  Because of the history of polysubstance abuse and a urine drug screen dated 03/24/01, positive for amphetamines, marijuana, and opiates, social work was involved with this patient during hospitalization.  A report was made to C[facility].  The patient denies current drug use.  Signed a consent for a drug screen for the baby, and plans to return to [facility] following discharge.  She is breast-feeding, and wants Depo-Provera at discharge for contraception.  She is discharged home in stable condition.  FOLLOWUP:  At [facility] six weeks postpartum.  She will bring her daughter, Ashley Jordan, to [facility] for pediatric care. Dictated by:   Ocie Doyne, M.D. Attending Physician:  Tammi Sou DD:  04/02/01 TD:  04/04/01 Job: [id number]

## 2010-08-02 ENCOUNTER — Ambulatory Visit (INDEPENDENT_AMBULATORY_CARE_PROVIDER_SITE_OTHER): Payer: Medicaid Other | Admitting: Pharmacist

## 2010-08-02 VITALS — BP 115/75 | Ht 64.5 in | Wt 163.0 lb

## 2010-08-02 DIAGNOSIS — J4540 Moderate persistent asthma, uncomplicated: Secondary | ICD-10-CM

## 2010-08-02 DIAGNOSIS — J45909 Unspecified asthma, uncomplicated: Secondary | ICD-10-CM

## 2010-08-02 MED ORDER — ALBUTEROL SULFATE (2.5 MG/3ML) 0.083% IN NEBU
2.5000 mg | INHALATION_SOLUTION | Freq: Once | RESPIRATORY_TRACT | Status: AC
  Administered 2010-08-02: 2.5 mg via RESPIRATORY_TRACT

## 2010-08-02 MED ORDER — FLUTICASONE-SALMETEROL 500-50 MCG/DOSE IN AEPB: 1.0000 | INHALATION_SPRAY | Freq: Two times a day (BID) | RESPIRATORY_TRACT | Status: DC

## 2010-08-02 NOTE — Patient Instructions (Signed)
Normal spirometry Finish current Advair inhaler and then pick up higher dose with next prescription.

## 2010-08-02 NOTE — Assessment & Plan Note (Signed)
A: Pt is a 6-8 year asthmatic with severe persistent asthma that is currently uncontrolled. She experiences 5-6 episodes of SOB per day including 1-2 episodes at night. Her FEV1 was 92% predicted and her FEV1/FVC = 0.8. She is currently on Advair 250/50 bid and albuterol. She is compliant with taking Advair regularly however she has been taking her albuterol scheduled with her Advair because she thought this is how she was supposed to take it. She is aware of factors that could worsen her asthma (environmental triggers) and works hard to avoid these. Patient has history of smoking which may have contributed to her asthma at some point however she currently does not smoke. She does also complain of allergies that remain persistent, especially in the spring/summer. She has tried multiple allergy medications (Clariten, Careers adviser) and is currently on Zyrtec and Fluticasone nasal spray daily and benadryl and hydroxyzine at night. She is congested in the clinic today and complains of itching--especially at night.  P: Increase Advair to 500/50 bid and continue albuterol inhaler every 4 hours as needed.  Utilize albuterol inhaler for acute episodes of SOB and not for maintenance use. Continue to avoid environmental triggers that could worsen you asthma. Continue Zyrtec and fluticasone for allergies and consider scheduling a visit with an allergy specialist. If smoking is restarted or frequent use of albuterol is required,  schedule another visit with Dr. Janalyn Harder or the Rx clinic.  Length of office visit ~ 40 minutes Patient seen with Georgina Pillion, PharmD Resident

## 2010-08-02 NOTE — Progress Notes (Signed)
  Subjective:    Patient ID: Ashley Jordan, female    DOB: 1971-02-19, 40 y.o.   MRN: [id number]  HPI Reviewed and agree with Dr. Macky Lower management    Review of Systems     Objective:   Physical Exam        Assessment & Plan:

## 2010-08-02 NOTE — Progress Notes (Signed)
  Subjective:    Patient ID: Ashley Jordan, female    DOB: 1970/10/25, 40 y.o.   MRN: [id number]  HPI  40 y.o who presents to Rx clinic for asthma management and spirometry reading. Her allergy and asthma symptoms presented around 6-8 years ago after the birth of her daughter (who is 86 y.o), she previously had no trouble She gets SOB multiple times a day--walking up 2 flights of stairs to get to her bathroom in her townhouse, walking 2-3 blocks outside on a hot day, and at night. She claims her symptoms are worse in the spring/summer with the hot weather and when the pollen and grass are in full bloom. She still has symptoms in the fall and winter but they are less frequent. She wakes about every 4-5 hours during the night with symptoms of SOB.  She claims her SOB affects her ability to work. She currently has to work from home. Her home has no carpet and she dusts regularly. She avoids sleeping on her pillow if she can. She keeps her house cool and has fans blowing because she feels it helps her breathe better.    Review of Systems     Objective:   Physical Exam        Assessment & Plan:   Asthma A: Pt is a 6-8 year asthmatic with severe persistent asthma that is currently uncontrolled. She experiences 5-6 episodes of SOB per day including 1-2 episodes at night. Her FEV1 was 92% predicted and her FEV1/FVC = 0.8. She is currently on Advair 250/50 bid and albuterol. She is compliant with taking Advair regularly however she has been taking her albuterol scheduled with her Advair because she thought this is how she was supposed to take it. She is aware of factors that could worsen her asthma (environmental triggers) and works hard to avoid these. Patient has history of smoking which may have contributed to her asthma at some point however she currently does not smoke. She does also complain of allergies that remain persistent, especially in the spring/summer. She has tried multiple allergy  medications (Clariten, Careers adviser) and is currently on Zyrtec and Fluticasone nasal spray daily and benadryl and hydroxyzine at night. She is congested in the clinic today and complains of itching--especially at night.  P: Increase Advair to 500/50 bid and continue albuterol inhaler every 4 hours as needed.  Utilize albuterol inhaler for acute episodes of SOB and not for maintenance use. Continue to avoid environmental triggers that could worsen you asthma. Continue Zyrtec and fluticasone for allergies and consider scheduling a visit with an allergy specialist. If smoking is restarted or frequent use of albuterol is required,  schedule another visit with Dr. Janalyn Harder or the Rx clinic.  Length of office visit ~ 40 minutes Patient seen with Georgina Pillion, PharmD Resident

## 2010-08-27 ENCOUNTER — Ambulatory Visit (INDEPENDENT_AMBULATORY_CARE_PROVIDER_SITE_OTHER): Payer: Medicaid Other | Admitting: Family Medicine

## 2010-08-27 ENCOUNTER — Encounter: Payer: Self-pay | Admitting: Family Medicine

## 2010-08-27 VITALS — BP 120/80 | HR 85 | Wt 162.0 lb

## 2010-08-27 DIAGNOSIS — A6000 Herpesviral infection of urogenital system, unspecified: Secondary | ICD-10-CM

## 2010-08-27 DIAGNOSIS — N946 Dysmenorrhea, unspecified: Secondary | ICD-10-CM

## 2010-08-27 MED ORDER — VALACYCLOVIR HCL 1 G PO TABS: 1000.0000 mg | ORAL_TABLET | Freq: Two times a day (BID) | ORAL | Status: DC

## 2010-08-27 MED ORDER — HYDROXYZINE PAMOATE 25 MG PO CAPS: ORAL_CAPSULE | ORAL | Status: DC

## 2010-08-27 NOTE — Progress Notes (Signed)
  Subjective:    Patient ID: Ashley Jordan, female    DOB: [birth year], 40 y.o.   MRN: [id number]  HPI Menstrual pain: She complains of menstrual pain since stopping Depo about 9 months ago.  Usually during her cycle she would hurt for 3-4 days.   Taking Ibuprofen 200mg  every 6 hours (about 3 times per day), which has not given her much relief.  She has also tried Naproxen once a day without relief.  Pain does not prevent her from doing her normal activities. Usually bleeds for 3-4 days, 2 of which is very light.   Sexually active.  Contraception: condoms Discussed IUD (Mirena).  She has one partner for about 4 yrs. No abnormal pap.   LMP: earlier this month.   Review of Systems  Constitutional: Negative for fever, chills and unexpected weight change.  Genitourinary: Negative for frequency, flank pain, vaginal bleeding, vaginal discharge, vaginal pain, pelvic pain and dyspareunia.       Objective:   Physical Exam  Constitutional: She is oriented to person, place, and time. She appears well-developed and well-nourished. No distress.  Neurological: She is alert and oriented to person, place, and time.          Assessment & Plan:

## 2010-08-27 NOTE — Patient Instructions (Addendum)
Please make appt for Pap smear. Please make appt for IUD.  For menstrual pain: Take Ibuprofen 600mg  - 800mg  (3-4 tablets) every 6 hours as needed for pain.

## 2010-08-27 NOTE — Assessment & Plan Note (Addendum)
Pt currently not in pain.  She describes 3-4 days of menstrual cramping during her cycles since she stopped using depo 9 months ago.  She has been taking Ibuprofen 200mg  about 3-4 times per day.  Advised that this dose is not adequate.  Advise that she can take 600mg  to 800mg  every 6 hours.   Gave her condoms today.

## 2010-09-30 ENCOUNTER — Encounter: Payer: Medicaid Other | Admitting: Emergency Medicine

## 2010-12-16 LAB — WET PREP, GENITAL

## 2010-12-16 LAB — POCT URINALYSIS DIP (DEVICE)
Glucose, UA: NEGATIVE
Nitrite: NEGATIVE
Operator id: 270961
Protein, ur: NEGATIVE
Specific Gravity, Urine: 1.025
Urobilinogen, UA: 0.2

## 2010-12-16 LAB — GC/CHLAMYDIA PROBE AMP, GENITAL
Chlamydia, DNA Probe: NEGATIVE
GC Probe Amp, Genital: NEGATIVE

## 2011-01-16 ENCOUNTER — Encounter: Payer: Self-pay | Admitting: Family Medicine

## 2011-01-16 ENCOUNTER — Ambulatory Visit (INDEPENDENT_AMBULATORY_CARE_PROVIDER_SITE_OTHER): Payer: Medicaid Other | Admitting: Family Medicine

## 2011-01-16 ENCOUNTER — Other Ambulatory Visit: Payer: Self-pay | Admitting: Family Medicine

## 2011-01-16 VITALS — BP 127/81 | HR 100 | Temp 98.2°F | Ht 64.0 in | Wt 176.2 lb

## 2011-01-16 DIAGNOSIS — N760 Acute vaginitis: Secondary | ICD-10-CM | POA: Insufficient documentation

## 2011-01-16 DIAGNOSIS — A499 Bacterial infection, unspecified: Secondary | ICD-10-CM

## 2011-01-16 DIAGNOSIS — R350 Frequency of micturition: Secondary | ICD-10-CM | POA: Insufficient documentation

## 2011-01-16 DIAGNOSIS — B9689 Other specified bacterial agents as the cause of diseases classified elsewhere: Secondary | ICD-10-CM

## 2011-01-16 DIAGNOSIS — R300 Dysuria: Secondary | ICD-10-CM

## 2011-01-16 LAB — POCT URINALYSIS DIPSTICK
Ketones, UA: NEGATIVE
Leukocytes, UA: NEGATIVE
Protein, UA: 30
pH, UA: 5.5

## 2011-01-16 LAB — POCT UA - MICROSCOPIC ONLY

## 2011-01-16 MED ORDER — IBUPROFEN 600 MG PO TABS: 600.0000 mg | ORAL_TABLET | Freq: Four times a day (QID) | ORAL | Status: DC | PRN

## 2011-01-16 MED ORDER — METRONIDAZOLE 0.75 % VA GEL: 1.0000 | Freq: Every day | VAGINAL | Status: AC

## 2011-01-16 MED ORDER — CEPHALEXIN 500 MG PO CAPS: 500.0000 mg | ORAL_CAPSULE | Freq: Two times a day (BID) | ORAL | Status: AC

## 2011-01-16 MED ORDER — METRONIDAZOLE 0.75 % VA GEL: 1.0000 | Freq: Every day | VAGINAL | Status: DC

## 2011-01-16 NOTE — Assessment & Plan Note (Signed)
Symptoms suggestive of uncomplicated UTI, will culture urine.  Given red flags to return if symptoms worsen would consider treatment for pyelonephritis.

## 2011-01-16 NOTE — Patient Instructions (Addendum)

## 2011-01-16 NOTE — Progress Notes (Signed)
  Subjective:    Patient ID: Nila Nephew, female    DOB: March 12, 1970, 40 y.o.   MRN: [id number]  HPI 40 yo here for same day appointment  2-3 days history of left side back pain and feeling of swelling.  Notes frequent urination, no dysuria, no vaginal discharge.  She feels she inadequately empties bladder.  Feels some abdominal bloating.  Has been taking azo with some relief.  No fever, chills, nausea, vomiting.  LMP: 12/18/10 only 1 day in duration. Would ike a pregnancy test.  No contraception.   Review of Systems See HPI    Objective:   Physical Exam GEN: Alert & Oriented, No acute distress CV:  Regular Rate & Rhythm, no murmur Respiratory:  Normal work of breathing, CTAB Abd:  + BS, soft, mildly tender in lower pelvis.  Left flank pain          Assessment & Plan:

## 2011-01-16 NOTE — Assessment & Plan Note (Signed)
Clue cells seen on ua.  Not likely cause of symptoms.  She would like to treat,.  rxed metrogel

## 2011-03-18 ENCOUNTER — Other Ambulatory Visit: Payer: Self-pay | Admitting: Emergency Medicine

## 2011-03-18 MED ORDER — IBUPROFEN 600 MG PO TABS: 600.0000 mg | ORAL_TABLET | Freq: Four times a day (QID) | ORAL | Status: DC | PRN

## 2011-05-12 ENCOUNTER — Telehealth: Payer: Self-pay | Admitting: *Deleted

## 2011-05-12 NOTE — Telephone Encounter (Signed)
Approval  received for  Advair. Pharmacy notifed.

## 2011-05-12 NOTE — Telephone Encounter (Signed)
PA required for Advair. Form placed in intern to do box.

## 2011-08-14 ENCOUNTER — Telehealth: Payer: Self-pay | Admitting: Emergency Medicine

## 2011-08-14 NOTE — Telephone Encounter (Signed)
Patient is calling because she needs a new Rx for Vistarill to go to [facility].  Also, she has some questions about getting her Advair.

## 2011-08-15 NOTE — Telephone Encounter (Signed)
Waiting for call back. Please tell pt: Unable to leave messag. Mailbox is full.  Pt needs to call pharmacy for refills and also schedule OV with new PCP. Ashley KitchenArlyss Repress

## 2011-08-26 ENCOUNTER — Other Ambulatory Visit: Payer: Self-pay | Admitting: *Deleted

## 2011-08-26 MED ORDER — HYDROXYZINE PAMOATE 25 MG PO CAPS: ORAL_CAPSULE | ORAL | Status: DC

## 2011-08-27 ENCOUNTER — Emergency Department (HOSPITAL_COMMUNITY)
Admission: EM | Admit: 2011-08-27 | Discharge: 2011-08-27 | Disposition: A | Discharge status: Home / Self Care | Payer: Medicaid Other | Source: Home / Self Care | Attending: Emergency Medicine | Admitting: Emergency Medicine

## 2011-08-27 ENCOUNTER — Encounter (HOSPITAL_COMMUNITY): Payer: Self-pay

## 2011-08-27 ENCOUNTER — Emergency Department (INDEPENDENT_AMBULATORY_CARE_PROVIDER_SITE_OTHER): Payer: Medicaid Other

## 2011-08-27 DIAGNOSIS — S93409A Sprain of unspecified ligament of unspecified ankle, initial encounter: Secondary | ICD-10-CM

## 2011-08-27 MED ORDER — TRAMADOL HCL 50 MG PO TABS: 50.0000 mg | ORAL_TABLET | Freq: Four times a day (QID) | ORAL | Status: DC | PRN

## 2011-08-27 MED ORDER — MELOXICAM 7.5 MG PO TABS: 7.5000 mg | ORAL_TABLET | Freq: Every day | ORAL | Status: DC

## 2011-08-27 MED ORDER — TRAMADOL HCL 50 MG PO TABS: 50.0000 mg | ORAL_TABLET | Freq: Four times a day (QID) | ORAL | Status: AC | PRN

## 2011-08-27 NOTE — ED Notes (Signed)
Pt states she twisted her rt ankle 2 days ago- "tripped inside her back door".  C/o pain and swelling to lateral aspect of rt ankle

## 2011-08-27 NOTE — Discharge Instructions (Signed)
If discomfort persists specially weight-bearing activities after 7-10 days followup with orthopedic Dr. as discussed. Is provided ankle splint for 5-7 days use crutches for 3 days only.

## 2011-08-27 NOTE — ED Provider Notes (Signed)
History     CSN: [id number]  Arrival date & time 08/27/11  1614   First MD Initiated Contact with Patient 08/27/11 1634      Chief Complaint  Patient presents with  . Ankle Pain    (Consider location/radiation/quality/duration/timing/severity/associated sxs/prior treatment) HPI Comments: Patient presents urgent care complaining of ongoing right ankle swelling and pain. She described that she tripped inside her back door and twisted her right ankle. She has been in nursing her ankle in Epsom salts and warm water. Today she woke up in her right ankle is much more swollen. Patient denies any numbness tingling sensation or weakness. It does hurt to walk on it.  Patient is a 41 y.o. female presenting with ankle pain. The history is provided by the patient.  Ankle Pain  The incident occurred more than 2 days ago. The incident occurred at home. The pain is present in the right ankle. The pain is at a severity of 5/10. The pain is moderate. The pain has been constant since onset. Associated symptoms include inability to bear weight. Pertinent negatives include no numbness, no loss of motion, no muscle weakness, no loss of sensation and no tingling. The treatment provided no relief.    Past Medical History  Diagnosis Date  . Gestational diabetes 2003  . PIH (pregnancy induced hypertension) 2003  . Asthma   . Genital herpes     History reviewed. No pertinent past surgical history.  Family History  Problem Relation Age of Onset  . Hypertension Mother   . Diabetes Father     History  Substance Use Topics  . Smoking status: Former Smoker -- 0.3 packs/day    Types: Cigarettes  . Smokeless tobacco: Never Used  . Alcohol Use: Yes    OB History    Grav Para Term Preterm Abortions TAB SAB Ect Mult Living   3 2 1 1 1  1   1       Review of Systems  Constitutional: Negative for fever, chills, activity change, appetite change and fatigue.  Musculoskeletal: Positive for joint swelling  and gait problem.  Skin: Negative for color change, pallor, rash and wound.  Neurological: Negative for tingling, weakness and numbness.    Allergies  Septra  Home Medications   Current Outpatient Rx  Name Route Sig Dispense Refill  . ALBUTEROL 90 MCG/ACT IN AERS Inhalation Inhale 2 puffs into the lungs every 4 (four) hours as needed. For wheeze/shortness of breath 17 g 3  . CETIRIZINE HCL 10 MG PO TABS Oral Take 1 tablet (10 mg total) by mouth daily. 30 tablet 6  . DIPHENHYDRAMINE HCL 25 MG PO CAPS  TAKE 1-2 CAPSULES BY MOUTH TWICE DAILY AS NEEDED FOR COUGH 30 capsule 0  . FLUTICASONE PROPIONATE 50 MCG/ACT NA SUSP  2 sprays per nostril once daily. Disp qs x 1 month 16 g 6  . FLUTICASONE-SALMETEROL 500-50 MCG/DOSE IN AEPB Inhalation Inhale 1 puff into the lungs every 12 (twelve) hours.    Marland Kitchen HYDROXYZINE PAMOATE 25 MG PO CAPS  Take 1 capsule by mouth at bedtime for allergic itching. 30 capsule 3  . VALACYCLOVIR HCL 1 G PO TABS Oral Take 1 tablet (1,000 mg total) by mouth 2 (two) times daily. 20 tablet 3  . FLUTICASONE-SALMETEROL 500-50 MCG/DOSE IN AEPB Inhalation Inhale 1 puff into the lungs 2 (two) times daily. 1 each 11  . MELOXICAM 7.5 MG PO TABS Oral Take 1 tablet (7.5 mg total) by mouth daily. 14 tablet 0  .  TRAMADOL HCL 50 MG PO TABS Oral Take 1 tablet (50 mg total) by mouth every 6 (six) hours as needed for pain. 15 tablet 0  . VALACYCLOVIR HCL 1 G PO TABS Oral Take 1 tablet (1,000 mg total) by mouth 2 (two) times daily. 1 tab by mouth two times a day for 10 days 20 tablet 0    BP 143/89  Pulse 76  Temp 98 F (36.7 C) (Oral)  Resp 20  SpO2 98%  LMP 08/05/2011  Physical Exam  Nursing note and vitals reviewed. Constitutional: She appears well-developed and well-nourished.  Musculoskeletal: She exhibits tenderness.       Right ankle: She exhibits decreased range of motion and swelling. She exhibits no ecchymosis, no deformity, no laceration and normal pulse. tenderness. Lateral  malleolus tenderness found. No medial malleolus, no AITFL, no CF ligament, no head of 5th metatarsal and no proximal fibula tenderness found.       Feet:  Neurological: She is alert.  Skin: No rash noted. No erythema. No pallor.    ED Course  Procedures (including critical care time)  Labs Reviewed - No data to display Dg Ankle Complete Right  08/27/2011  *RADIOLOGY REPORT*  Clinical Data: Fall.  Ankle injury, pain, and swelling.  RIGHT ANKLE - COMPLETE 3+ VIEW  Comparison: None.  Findings: Moderate soft tissue swelling is seen overlying the lateral malleolus.  No evidence of fracture or dislocation.  No other significant bone abnormality identified.  No definite evidence of ankle joint effusion.  IMPRESSION: Lateral soft tissue swelling.  No evidence of fracture.  Original Report Authenticated By: Danae Orleans, M.D.     1. Ankle sprain and strain       MDM   Right ankle sprain and strain. No neural vascular deficits. Patient was put on crutches and an ankle air splint. Encouraged followup with orthopedic Dr. if no improvement or weightbearing pain after 7-10 days. Patient understood instructions and agrees with current treatment and followup as necessary.       Jimmie Molly, MD 08/27/11 2031

## 2011-11-28 ENCOUNTER — Encounter (HOSPITAL_COMMUNITY): Payer: Self-pay

## 2011-11-28 ENCOUNTER — Emergency Department (HOSPITAL_COMMUNITY)
Admission: EM | Admit: 2011-11-28 | Discharge: 2011-11-28 | Disposition: A | Discharge status: Home / Self Care | Payer: Medicaid Other | Source: Home / Self Care

## 2011-11-28 ENCOUNTER — Telehealth: Payer: Self-pay | Admitting: Emergency Medicine

## 2011-11-28 DIAGNOSIS — R1031 Right lower quadrant pain: Secondary | ICD-10-CM

## 2011-11-28 DIAGNOSIS — R52 Pain, unspecified: Secondary | ICD-10-CM

## 2011-11-28 DIAGNOSIS — N390 Urinary tract infection, site not specified: Secondary | ICD-10-CM

## 2011-11-28 LAB — CBC WITH DIFFERENTIAL/PLATELET
Basophils Absolute: 0 10*3/uL (ref 0.0–0.1)
Basophils Relative: 0 % (ref 0–1)
Hemoglobin: 13.6 g/dL (ref 12.0–15.0)
MCHC: 33.2 g/dL (ref 30.0–36.0)
Monocytes Relative: 7 % (ref 3–12)
Neutro Abs: 1.6 10*3/uL — ABNORMAL LOW (ref 1.7–7.7)
Neutrophils Relative %: 26 % — ABNORMAL LOW (ref 43–77)
Platelets: 251 10*3/uL (ref 150–400)
RDW: 13.7 % (ref 11.5–15.5)

## 2011-11-28 LAB — POCT URINALYSIS DIP (DEVICE)
Ketones, ur: NEGATIVE mg/dL
Leukocytes, UA: NEGATIVE
Nitrite: NEGATIVE
Protein, ur: 30 mg/dL — AB

## 2011-11-28 LAB — POCT PREGNANCY, URINE: Preg Test, Ur: NEGATIVE

## 2011-11-28 MED ORDER — CEPHALEXIN 500 MG PO CAPS: 500.0000 mg | ORAL_CAPSULE | Freq: Three times a day (TID) | ORAL | Status: DC

## 2011-11-28 NOTE — ED Notes (Signed)
C/o lower abdominal area pain and lower back pain; concern for UTI or kidney infection; denies concern for STD today

## 2011-11-28 NOTE — Telephone Encounter (Signed)
Pt asking to be worked in, thinks she has a kidney or bladder infection

## 2011-11-28 NOTE — ED Provider Notes (Signed)
Medical screening examination/treatment/procedure(s) were performed by non-physician practitioner and as supervising physician I was immediately available for consultation/collaboration.  Maiana Hennigan   Demareon Coldwell, MD 11/28/11 1717 

## 2011-11-28 NOTE — ED Provider Notes (Signed)
History     CSN: [id number]  Arrival date & time 11/28/11  1437   None     Chief Complaint  Patient presents with  . Urinary Tract Infection    (Consider location/radiation/quality/duration/timing/severity/associated sxs/prior treatment) HPI Comments: 41 year old patient presents with a complaint UTI. She is experiencing pain in her  low back and suprapubis. She complains of urinary frequency, urgency and low voiding volumes. She also has fatigue and sleepiness. The symptoms began approximately 3 days ago. She denies fever or upper respiratory symptoms.   Past Medical History  Diagnosis Date  . Gestational diabetes 2003  . PIH (pregnancy induced hypertension) 2003  . Asthma   . Genital herpes     History reviewed. No pertinent past surgical history.  Family History  Problem Relation Age of Onset  . Hypertension Mother   . Diabetes Father     History  Substance Use Topics  . Smoking status: Former Smoker -- 0.3 packs/day    Types: Cigarettes  . Smokeless tobacco: Never Used  . Alcohol Use: Yes    OB History    Grav Para Term Preterm Abortions TAB SAB Ect Mult Living   3 2 1 1 1  1   1       Review of Systems  Constitutional: Positive for activity change. Negative for fever and diaphoresis.  HENT: Negative.   Respiratory: Negative.   Cardiovascular: Negative.   Gastrointestinal: Negative.   Genitourinary:       As per HPI  Musculoskeletal: Negative for arthralgias and gait problem.  Neurological: Negative.     Allergies  Septra  Home Medications   Current Outpatient Rx  Name Route Sig Dispense Refill  . ALBUTEROL 90 MCG/ACT IN AERS Inhalation Inhale 2 puffs into the lungs every 4 (four) hours as needed. For wheeze/shortness of breath 17 g 3  . CETIRIZINE HCL 10 MG PO TABS Oral Take 1 tablet (10 mg total) by mouth daily. 30 tablet 6  . DIPHENHYDRAMINE HCL 25 MG PO CAPS  TAKE 1-2 CAPSULES BY MOUTH TWICE DAILY AS NEEDED FOR COUGH 30 capsule 0  .  CEPHALEXIN 500 MG PO CAPS Oral Take 1 capsule (500 mg total) by mouth 3 (three) times daily. 21 capsule 0  . FLUTICASONE PROPIONATE 50 MCG/ACT NA SUSP  2 sprays per nostril once daily. Disp qs x 1 month 16 g 6  . FLUTICASONE-SALMETEROL 500-50 MCG/DOSE IN AEPB Inhalation Inhale 1 puff into the lungs 2 (two) times daily. 1 each 11  . FLUTICASONE-SALMETEROL 500-50 MCG/DOSE IN AEPB Inhalation Inhale 1 puff into the lungs every 12 (twelve) hours.    Marland Kitchen HYDROXYZINE PAMOATE 25 MG PO CAPS  Take 1 capsule by mouth at bedtime for allergic itching. 30 capsule 3  . MELOXICAM 7.5 MG PO TABS Oral Take 1 tablet (7.5 mg total) by mouth daily. 14 tablet 0  . VALACYCLOVIR HCL 1 G PO TABS Oral Take 1 tablet (1,000 mg total) by mouth 2 (two) times daily. 20 tablet 3  . VALACYCLOVIR HCL 1 G PO TABS Oral Take 1 tablet (1,000 mg total) by mouth 2 (two) times daily. 1 tab by mouth two times a day for 10 days 20 tablet 0    BP 130/74  Pulse 78  Temp 98.2 F (36.8 C) (Oral)  Resp 16  SpO2 96%  LMP 11/09/2011  Physical Exam  Constitutional: She is oriented to person, place, and time. She appears well-developed and well-nourished. No distress.  Neck: Normal range of motion. Neck  supple.  Cardiovascular: Normal rate and normal heart sounds.   Pulmonary/Chest: Effort normal and breath sounds normal.  Abdominal: Soft.       Tenderness over the suprapubic epigastrium. Complains of mild tenderness in the right lower quadrant no rebound or guarding. Abdomen is soft, bowel sounds normal active  Musculoskeletal: Normal range of motion. She exhibits no edema.  Lymphadenopathy:    She has no cervical adenopathy.  Neurological: She is alert and oriented to person, place, and time.  Skin: Skin is warm and dry.    ED Course  Procedures (including critical care time)  Labs Reviewed  POCT URINALYSIS DIP (DEVICE) - Abnormal; Notable for the following:    Bilirubin Urine SMALL (*)     Hgb urine dipstick LARGE (*)      Protein, ur 30 (*)     All other components within normal limits  CBC WITH DIFFERENTIAL - Abnormal; Notable for the following:    Neutrophils Relative 26 (*)     Neutro Abs 1.6 (*)     Lymphocytes Relative 58 (*)     Eosinophils Relative 9 (*)     All other components within normal limits  POCT PREGNANCY, URINE   No results found.   1. UTI (lower urinary tract infection)   2. Abdominal pain, acute, right lower quadrant       MDM   Results for orders placed during the hospital encounter of 11/28/11  POCT URINALYSIS DIP (DEVICE)      Component Value Range   Glucose, UA NEGATIVE  NEGATIVE mg/dL   Bilirubin Urine SMALL (*) NEGATIVE   Ketones, ur NEGATIVE  NEGATIVE mg/dL   Specific Gravity, Urine >=1.030  1.005 - 1.030   Hgb urine dipstick LARGE (*) NEGATIVE   pH 5.5  5.0 - 8.0   Protein, ur 30 (*) NEGATIVE mg/dL   Urobilinogen, UA 0.2  0.0 - 1.0 mg/dL   Nitrite NEGATIVE  NEGATIVE   Leukocytes, UA NEGATIVE  NEGATIVE  POCT PREGNANCY, URINE      Component Value Range   Preg Test, Ur NEGATIVE  NEGATIVE  CBC WITH DIFFERENTIAL      Component Value Range   WBC 6.1  4.0 - 10.5 K/uL   RBC 4.54  3.87 - 5.11 MIL/uL   Hemoglobin 13.6  12.0 - 15.0 g/dL   HCT 82.9  56.2 - 13.0 %   MCV 90.3  78.0 - 100.0 fL   MCH 30.0  26.0 - 34.0 pg   MCHC 33.2  30.0 - 36.0 g/dL   RDW 86.5  78.4 - 69.6 %   Platelets 251  150 - 400 K/uL   Neutrophils Relative 26 (*) 43 - 77 %   Neutro Abs 1.6 (*) 1.7 - 7.7 K/uL   Lymphocytes Relative 58 (*) 12 - 46 %   Lymphs Abs 3.5  0.7 - 4.0 K/uL   Monocytes Relative 7  3 - 12 %   Monocytes Absolute 0.4  0.1 - 1.0 K/uL   Eosinophils Relative 9 (*) 0 - 5 %   Eosinophils Absolute 0.6  0.0 - 0.7 K/uL   Basophils Relative 0  0 - 1 %   Basophils Absolute 0.0  0.0 - 0.1 K/uL   Keflex 500 tid for 8 d Plenty of fluids.  For worsening, new sx's, problems return or go to ED or see your PCP        Hayden Rasmussen, NP 11/28/11 1658

## 2011-11-28 NOTE — Telephone Encounter (Signed)
Spoke with patient and explained that our scheduled is completely booked today and advised her to go to Urgent Care . She voices understanding.

## 2011-12-18 ENCOUNTER — Encounter (HOSPITAL_COMMUNITY): Payer: Self-pay | Admitting: Emergency Medicine

## 2011-12-18 ENCOUNTER — Emergency Department (HOSPITAL_COMMUNITY)
Admission: EM | Admit: 2011-12-18 | Discharge: 2011-12-18 | Disposition: A | Discharge status: Home / Self Care | Payer: Medicaid Other | Attending: Emergency Medicine | Admitting: Emergency Medicine

## 2011-12-18 ENCOUNTER — Emergency Department (HOSPITAL_COMMUNITY): Payer: Medicaid Other

## 2011-12-18 DIAGNOSIS — Z888 Allergy status to other drugs, medicaments and biological substances status: Secondary | ICD-10-CM | POA: Insufficient documentation

## 2011-12-18 DIAGNOSIS — Z833 Family history of diabetes mellitus: Secondary | ICD-10-CM | POA: Insufficient documentation

## 2011-12-18 DIAGNOSIS — W010XXA Fall on same level from slipping, tripping and stumbling without subsequent striking against object, initial encounter: Secondary | ICD-10-CM | POA: Insufficient documentation

## 2011-12-18 DIAGNOSIS — Z87891 Personal history of nicotine dependence: Secondary | ICD-10-CM | POA: Insufficient documentation

## 2011-12-18 DIAGNOSIS — J45909 Unspecified asthma, uncomplicated: Secondary | ICD-10-CM | POA: Insufficient documentation

## 2011-12-18 DIAGNOSIS — M5416 Radiculopathy, lumbar region: Secondary | ICD-10-CM

## 2011-12-18 DIAGNOSIS — E119 Type 2 diabetes mellitus without complications: Secondary | ICD-10-CM | POA: Insufficient documentation

## 2011-12-18 DIAGNOSIS — IMO0002 Reserved for concepts with insufficient information to code with codable children: Secondary | ICD-10-CM | POA: Insufficient documentation

## 2011-12-18 DIAGNOSIS — Z8249 Family history of ischemic heart disease and other diseases of the circulatory system: Secondary | ICD-10-CM | POA: Insufficient documentation

## 2011-12-18 MED ORDER — OXYCODONE-ACETAMINOPHEN 5-325 MG PO TABS
1.0000 | ORAL_TABLET | Freq: Once | ORAL | Status: AC
  Administered 2011-12-18: 1 via ORAL
  Filled 2011-12-18: qty 1

## 2011-12-18 MED ORDER — OXYCODONE-ACETAMINOPHEN 5-325 MG PO TABS: 1.0000 | ORAL_TABLET | ORAL | Status: DC | PRN

## 2011-12-18 MED ORDER — NAPROXEN 500 MG PO TABS: 500.0000 mg | ORAL_TABLET | Freq: Two times a day (BID) | ORAL | Status: DC

## 2011-12-18 MED ORDER — PREDNISONE 10 MG PO TABS: ORAL_TABLET | ORAL | Status: DC

## 2011-12-18 MED ORDER — HYDROCODONE-ACETAMINOPHEN 5-500 MG PO TABS: 1.0000 | ORAL_TABLET | Freq: Four times a day (QID) | ORAL | Status: DC | PRN

## 2011-12-18 NOTE — ED Notes (Signed)
Pt verbalizes understanding 

## 2011-12-18 NOTE — ED Notes (Signed)
Pt presenting to ed with c/o falling on cement ground and landing on her back pt states pain is in her lower back and radiating into her legs pt states ground was wet and she slipped

## 2011-12-18 NOTE — ED Provider Notes (Signed)
History     CSN: [id number]  Arrival date & time 12/18/11  1404   First MD Initiated Contact with Patient 12/18/11 1608      Chief Complaint  Patient presents with  . Fall  . Back Pain    (Consider location/radiation/quality/duration/timing/severity/associated sxs/prior treatment) HPI Comments: Ashley Jordan is a 41 y.o. Female who presents with complaint of a fall. Pt states she was walking out of class outside, where it was raining, and slipped falling onto the back. States pain in lower back and tail bone. States pain radiating down both legs. Denies numbness or weakness in legs. Denies abdominal pain. No problems using bathroom. No other complaints.     Past Medical History  Diagnosis Date  . PIH (pregnancy induced hypertension) 2003  . Asthma   . Genital herpes   . Diabetes mellitus without complication     History reviewed. No pertinent past surgical history.  Family History  Problem Relation Age of Onset  . Hypertension Mother   . Diabetes Father     History  Substance Use Topics  . Smoking status: Former Smoker -- 0.3 packs/day    Types: Cigarettes  . Smokeless tobacco: Never Used  . Alcohol Use: Yes     social    OB History    Grav Para Term Preterm Abortions TAB SAB Ect Mult Living   3 2 1 1 1  1   1       Review of Systems  Constitutional: Negative for fever and chills.  HENT: Negative for neck pain and neck stiffness.   Respiratory: Negative.   Cardiovascular: Negative.   Gastrointestinal: Negative.   Genitourinary: Negative for dysuria and flank pain.  Musculoskeletal: Positive for back pain and gait problem.  Skin: Negative.   Neurological: Negative for weakness, numbness and headaches.  Hematological: Negative.     Allergies  Septra  Home Medications   Current Outpatient Rx  Name Route Sig Dispense Refill  . ALBUTEROL 90 MCG/ACT IN AERS Inhalation Inhale 2 puffs into the lungs every 4 (four) hours as needed. For wheeze/shortness of  breath    . CEPHALEXIN 500 MG PO CAPS Oral Take 500 mg by mouth 3 (three) times daily.    Marland Kitchen CETIRIZINE HCL 10 MG PO TABS Oral Take 10 mg by mouth daily.    Marland Kitchen FLUTICASONE PROPIONATE 50 MCG/ACT NA SUSP  2 sprays per nostril once daily. Disp qs x 1 month    . FLUTICASONE-SALMETEROL 500-50 MCG/DOSE IN AEPB Inhalation Inhale 1 puff into the lungs every 12 (twelve) hours.    Marland Kitchen HYDROXYZINE PAMOATE 25 MG PO CAPS Oral Take 25 mg by mouth 3 (three) times daily as needed. Take 1 capsule by mouth at bedtime for allergic itching.    . IBUPROFEN 200 MG PO TABS Oral Take 200 mg by mouth every 6 (six) hours as needed. Back pain    . FLUTICASONE-SALMETEROL 500-50 MCG/DOSE IN AEPB Inhalation Inhale 1 puff into the lungs 2 (two) times daily. 1 each 11  . PHENAZOPYRIDINE HCL 95 MG PO TABS Oral Take 95 mg by mouth 3 (three) times daily as needed. Urinary infection    . VALACYCLOVIR HCL 1 G PO TABS Oral Take 1 tablet (1,000 mg total) by mouth 2 (two) times daily. 20 tablet 3  . VALACYCLOVIR HCL 1 G PO TABS Oral Take 1 tablet (1,000 mg total) by mouth 2 (two) times daily. 1 tab by mouth two times a day for 10 days 20 tablet 0  BP 110/53  Pulse 97  Temp 98.2 F (36.8 C) (Oral)  Resp 20  SpO2 100%  LMP 12/01/2011  Physical Exam  Nursing note and vitals reviewed. Constitutional: She appears well-developed and well-nourished. No distress.  Eyes: Conjunctivae normal are normal.  Neck: Neck supple.  Cardiovascular: Normal rate, regular rhythm and normal heart sounds.   Pulmonary/Chest: Effort normal and breath sounds normal. No respiratory distress. She has no wheezes. She has no rales.  Musculoskeletal:       midline lumbar, sacrum, coccyx tenderness. There is paraspinal tenderness over lumbar spine. Pain with bilateral straight leg raise  Neurological:       5/5 and equal lower extremity strength bilaterally, 2+ patellar reflexes bilaterally. Pt able to dorsiflex bilateral toes and feet  Skin: Skin is warm  and dry.  Psychiatric: She has a normal mood and affect.    ED Course  Procedures (including critical care time)  Dg Lumbar Spine Complete  12/18/2011  *RADIOLOGY REPORT*  Clinical Data: Pain post trauma  LUMBAR SPINE - COMPLETE 4+ VIEW  Comparison: None.  Findings:  Frontal, lateral, spot lumbosacral lateral, and bilateral oblique views were obtained. There are five non-rib bearing lumbar type vertebral bodies.  There is no fracture or spondylolisthesis.  Disc spaces appear intact.  There is no appreciable facet arthropathy.  IMPRESSION:  No fracture or appreciable arthropathy.   Original Report Authenticated By: Arvin Collard. Margarita Grizzle III, M.D.    Dg Sacrum/coccyx  12/18/2011  *RADIOLOGY REPORT*  Clinical Data: Pain post trauma  SACRUM AND COCCYX - 2+ VIEW  Comparison: None.  Findings: Frontal and lateral views were obtained.  No apparent fracture or diastasis.  Joint spaces appear intact. No erosive change.  IMPRESSION: No abnormality noted.   Original Report Authenticated By: Arvin Collard. WOODRUFF III, M.D.      1. Lumbar radiculopathy       MDM  X-rays negative. Pt neurovascularly intact. No signs of cauda equina. VS normal. Will d/c home with pain medications, follow up with orthopedics if not improving or primary care doctor.           Lottie Mussel, PA 12/19/11 [phone]

## 2011-12-20 NOTE — ED Provider Notes (Signed)
Medical screening examination/treatment/procedure(s) were performed by non-physician practitioner and as supervising physician I was immediately available for consultation/collaboration.    Skye Plamondon R Sebrena Engh, MD 12/20/11 1652 

## 2011-12-31 ENCOUNTER — Encounter: Payer: Self-pay | Admitting: Emergency Medicine

## 2011-12-31 ENCOUNTER — Ambulatory Visit (INDEPENDENT_AMBULATORY_CARE_PROVIDER_SITE_OTHER): Payer: Medicaid Other | Admitting: Emergency Medicine

## 2011-12-31 VITALS — BP 130/89 | HR 88 | Temp 98.3°F | Ht 64.0 in | Wt 174.0 lb

## 2011-12-31 DIAGNOSIS — M545 Low back pain, unspecified: Secondary | ICD-10-CM | POA: Insufficient documentation

## 2011-12-31 MED ORDER — MELOXICAM 15 MG PO TABS: 15.0000 mg | ORAL_TABLET | Freq: Every day | ORAL | Status: DC

## 2011-12-31 MED ORDER — CYCLOBENZAPRINE HCL 5 MG PO TABS: 5.0000 mg | ORAL_TABLET | Freq: Three times a day (TID) | ORAL | Status: DC | PRN

## 2011-12-31 MED ORDER — CETIRIZINE HCL 10 MG PO TABS: 10.0000 mg | ORAL_TABLET | Freq: Every day | ORAL | Status: DC

## 2011-12-31 MED ORDER — HYDROXYZINE PAMOATE 25 MG PO CAPS: 25.0000 mg | ORAL_CAPSULE | Freq: Three times a day (TID) | ORAL | Status: DC | PRN

## 2011-12-31 MED ORDER — FLUTICASONE-SALMETEROL 500-50 MCG/DOSE IN AEPB: 1.0000 | INHALATION_SPRAY | Freq: Two times a day (BID) | RESPIRATORY_TRACT | Status: DC

## 2011-12-31 MED ORDER — OXYCODONE-ACETAMINOPHEN 5-325 MG PO TABS: 1.0000 | ORAL_TABLET | Freq: Three times a day (TID) | ORAL | Status: DC | PRN

## 2011-12-31 MED ORDER — ALBUTEROL SULFATE HFA 108 (90 BASE) MCG/ACT IN AERS: 2.0000 | INHALATION_SPRAY | Freq: Four times a day (QID) | RESPIRATORY_TRACT | Status: DC | PRN

## 2011-12-31 NOTE — Assessment & Plan Note (Signed)
Injury 2 weeks ago.  Seen in ED, x-rays unremarkable.  Does have some pain that radiates into the legs, but no true radicular symptoms.  Neuro exam reassuring.  Will manage conservatively with Meloxicam 15mg  daily, tylenol as needed, percocet 5-325 at bedtime prn, flexeril q8hr prn.  Follow up in 4 weeks.  If no improvement at that time, will need to consider further imaging with MRI.

## 2011-12-31 NOTE — Progress Notes (Signed)
  Subjective:    Patient ID: Ashley Jordan, female    DOB: Sep 04, 1970, 41 y.o.   MRN: [id number]  HPI Ashley Jordan is here for a SDA for low back pain.  States she fell about 2 weeks ago.  Slipped and landed on right side/back.  Back located across low back, worse on right, travels down the back of both legs to the knee. No numbness, tingling or weakness.  No bowel/bladder incontinence.  Was seen in the [facility] following the fall.  X-rays were negative.  Given a steroid burst, percocet and naprosyn.  Since then, has not noticed any change in the pain.   I have reviewed and updated the following as appropriate: allergies, current medications and problem list SHx: former smoker   Review of Systems See HPI    Objective:   Physical Exam BP 130/89  Pulse 88  Temp 98.3 F (36.8 C) (Oral)  Ht 5\' 4"  (1.626 m)  Wt 174 lb (78.926 kg)  BMI 29.87 kg/m2  LMP 12/01/2011 Gen: alert, cooperative, NAD, some discomfort with lying flat Back: lumbar paraspinous muscle spasm, worse on right; no obvious bony deformity; tender along paraspinous muscles, worse on right Neuro: negative SLR bilaterally; 5/5 strength in all muscle groups some pain with hamstrings and calves; 1+ symmetric patellar DTR     Assessment & Plan:

## 2011-12-31 NOTE — Patient Instructions (Addendum)
I'm sorry your back is hurting.  This typically takes 6 weeks to get better. 1. STOP naprosyn 2. STOP ibuprofen 3. START meloxicam 1 tablet daily 4. START flexeril 5mg  every 8 hours as needed for muscle spasm.  This may make you tired or loopy. 5. Continue percocet at bedtime as needed. 6. Okay to take Tylenol (acetaminophen) during the day for added pain control.  You can take up to 4 extra strength tablets a day.  Come back in about 1 month.  If the pain is no better, we may need to consider further imaging or referral.

## 2012-02-02 ENCOUNTER — Ambulatory Visit: Payer: Medicaid Other | Admitting: Emergency Medicine

## 2012-02-04 ENCOUNTER — Ambulatory Visit: Payer: Medicaid Other | Admitting: Family Medicine

## 2012-04-28 ENCOUNTER — Ambulatory Visit (INDEPENDENT_AMBULATORY_CARE_PROVIDER_SITE_OTHER): Payer: Medicaid Other | Admitting: Family Medicine

## 2012-04-28 ENCOUNTER — Encounter: Payer: Self-pay | Admitting: Family Medicine

## 2012-04-28 VITALS — BP 115/89 | HR 91 | Temp 99.4°F | Ht 64.0 in | Wt 166.5 lb

## 2012-04-28 DIAGNOSIS — O09899 Supervision of other high risk pregnancies, unspecified trimester: Secondary | ICD-10-CM | POA: Insufficient documentation

## 2012-04-28 DIAGNOSIS — Z3480 Encounter for supervision of other normal pregnancy, unspecified trimester: Secondary | ICD-10-CM | POA: Insufficient documentation

## 2012-04-28 DIAGNOSIS — N912 Amenorrhea, unspecified: Secondary | ICD-10-CM

## 2012-04-28 DIAGNOSIS — O09219 Supervision of pregnancy with history of pre-term labor, unspecified trimester: Secondary | ICD-10-CM

## 2012-04-28 LAB — POCT URINE PREGNANCY: Preg Test, Ur: POSITIVE

## 2012-04-28 NOTE — Assessment & Plan Note (Signed)
Uncertain LMP since was not normal, will send for dating U/S.  Will need to be followed at [facility] due to 32wk fetal demise. Has been followed there previously per pt report.

## 2012-04-28 NOTE — Patient Instructions (Addendum)
It was nice to meet you today.  You're pregnant!    I'm going to have you go and get a dating ultrasound since you had bleeding last month.  Start taking a prenatal vitamin.    I want you to follow up with [facility].  I am putting the referral in, but feel free to call them on your own in the next few days.

## 2012-04-28 NOTE — Progress Notes (Signed)
S: Pt comes in today for SDA for feeling sluggish and tired.  Started feeling this way about 1-2 weeks ago.  Also has felt breast tenderness for the name time period.  LMP was 1/10 or 1/13, was normal except only lasted 1 day, when usually they last 3 days; normal heaviness for that 1 day.  Was not using any form of birth control.  Same partner for 3 years, was supposed to get married Valentine's Day but couldn't because of finances.  No spotting or bleeding other than possible period.  No N/V.  Nonsmoker.    [id number] (32 wk SAB, 1 TAB, 1 live birth- 42 yo F)  Was seen previously in [facility] because of her late fetal demise at 51 weeks.  If LMP is accurate, pt would be 5wk 5d today with EDC 12/24/12   ROS: Per HPI  History  Smoking status  . Former Smoker -- 0.30 packs/day  . Types: Cigarettes  Smokeless tobacco  . Never Used    O:  Filed Vitals:   04/28/12 1348  BP: 115/89  Pulse: 91  Temp: 99.4 F (37.4 C)    Gen: NAD CV: RRR, no murmur Pulm: CTA bilat, no wheezes or crackles Abd: soft, NT   A/P: 42 y.o. female p/w pregnancy -See problem list -f/u in [facility] ([place])

## 2012-05-03 ENCOUNTER — Encounter (HOSPITAL_COMMUNITY): Payer: Self-pay

## 2012-05-03 ENCOUNTER — Ambulatory Visit (HOSPITAL_COMMUNITY)
Admission: RE | Admit: 2012-05-03 | Discharge: 2012-05-03 | Disposition: A | Discharge status: Home / Self Care | Payer: Medicaid Other | Source: Ambulatory Visit | Attending: Family Medicine | Admitting: Family Medicine

## 2012-05-03 ENCOUNTER — Other Ambulatory Visit: Payer: Self-pay | Admitting: Family Medicine

## 2012-05-03 DIAGNOSIS — Z3689 Encounter for other specified antenatal screening: Secondary | ICD-10-CM | POA: Insufficient documentation

## 2012-05-03 DIAGNOSIS — Z3480 Encounter for supervision of other normal pregnancy, unspecified trimester: Secondary | ICD-10-CM

## 2012-05-24 ENCOUNTER — Encounter: Payer: Medicaid Other | Admitting: Obstetrics and Gynecology

## 2012-06-03 ENCOUNTER — Encounter: Payer: Medicaid Other | Admitting: Obstetrics & Gynecology

## 2012-06-03 ENCOUNTER — Encounter: Payer: Self-pay | Admitting: *Deleted

## 2012-06-11 ENCOUNTER — Telehealth: Payer: Self-pay | Admitting: Emergency Medicine

## 2012-06-11 MED ORDER — VALACYCLOVIR HCL 1 G PO TABS: 1000.0000 mg | ORAL_TABLET | Freq: Two times a day (BID) | ORAL | Status: DC

## 2012-06-11 NOTE — Telephone Encounter (Signed)
Will send prescription for Valtrex 1g BID x3 days at start of outbreak.

## 2012-06-11 NOTE — Telephone Encounter (Signed)
Pt is out of Valtrex and pharmacy is taking too long.  Needs asap  [facility]- [place]

## 2012-07-22 ENCOUNTER — Encounter: Payer: Self-pay | Admitting: Emergency Medicine

## 2012-07-22 ENCOUNTER — Other Ambulatory Visit (HOSPITAL_COMMUNITY)
Admission: RE | Admit: 2012-07-22 | Discharge: 2012-07-22 | Disposition: A | Discharge status: Home / Self Care | Payer: Medicaid Other | Source: Ambulatory Visit | Attending: Family Medicine | Admitting: Family Medicine

## 2012-07-22 ENCOUNTER — Ambulatory Visit (INDEPENDENT_AMBULATORY_CARE_PROVIDER_SITE_OTHER): Payer: Medicaid Other | Admitting: Emergency Medicine

## 2012-07-22 VITALS — BP 134/85 | HR 84 | Ht 64.0 in | Wt 163.0 lb

## 2012-07-22 DIAGNOSIS — Z3480 Encounter for supervision of other normal pregnancy, unspecified trimester: Secondary | ICD-10-CM

## 2012-07-22 DIAGNOSIS — Z124 Encounter for screening for malignant neoplasm of cervix: Secondary | ICD-10-CM

## 2012-07-22 DIAGNOSIS — N72 Inflammatory disease of cervix uteri: Secondary | ICD-10-CM

## 2012-07-22 DIAGNOSIS — M545 Low back pain: Secondary | ICD-10-CM

## 2012-07-22 DIAGNOSIS — Z0000 Encounter for general adult medical examination without abnormal findings: Secondary | ICD-10-CM | POA: Insufficient documentation

## 2012-07-22 DIAGNOSIS — Z3201 Encounter for pregnancy test, result positive: Secondary | ICD-10-CM

## 2012-07-22 DIAGNOSIS — J45909 Unspecified asthma, uncomplicated: Secondary | ICD-10-CM

## 2012-07-22 DIAGNOSIS — Z01419 Encounter for gynecological examination (general) (routine) without abnormal findings: Secondary | ICD-10-CM | POA: Insufficient documentation

## 2012-07-22 DIAGNOSIS — Z113 Encounter for screening for infections with a predominantly sexual mode of transmission: Secondary | ICD-10-CM | POA: Insufficient documentation

## 2012-07-22 DIAGNOSIS — Z1151 Encounter for screening for human papillomavirus (HPV): Secondary | ICD-10-CM | POA: Insufficient documentation

## 2012-07-22 DIAGNOSIS — N912 Amenorrhea, unspecified: Secondary | ICD-10-CM

## 2012-07-22 LAB — POCT URINE PREGNANCY: Preg Test, Ur: POSITIVE

## 2012-07-22 MED ORDER — ALBUTEROL SULFATE HFA 108 (90 BASE) MCG/ACT IN AERS: 2.0000 | INHALATION_SPRAY | Freq: Four times a day (QID) | RESPIRATORY_TRACT | Status: DC | PRN

## 2012-07-22 MED ORDER — CYCLOBENZAPRINE HCL 5 MG PO TABS: 5.0000 mg | ORAL_TABLET | Freq: Three times a day (TID) | ORAL | Status: DC | PRN

## 2012-07-22 MED ORDER — FLUTICASONE-SALMETEROL 500-50 MCG/DOSE IN AEPB: 1.0000 | INHALATION_SPRAY | Freq: Two times a day (BID) | RESPIRATORY_TRACT | Status: DC

## 2012-07-22 NOTE — Assessment & Plan Note (Signed)
Likely miscarriage; however UPT is positive.  Will get quant hcg and ultrasound.  F/u with me after ultrasound.

## 2012-07-22 NOTE — Progress Notes (Signed)
  Subjective:    Patient ID: Ashley Jordan, female    DOB: 01-Feb-1971, 42 y.o.   MRN: [id number]  HPI Ashley Jordan is here for annual exam and miscarriage.  Miscarriage: In February, she had a positive pregnancy test.  On Easter Sunday she experienced a lot of vaginal bleeding with clots.  Since then, she has had spotting and abdominal cramping.  She did not see a doctor.  Still feels tired and eating like she is pregnant, but does not feel like her stomach is growing.     I have reviewed and updated the following as appropriate: allergies, current medications, past family history, past medical history, past social history and past surgical history PMHx: allergies and asthma  SHx: former smoker - States she has urinary frequency that she sees a doctor in [place] for through the [place] - Would like a letter and form completed for her to get a new apt through [place] [facility] for her allergies.  She states that she has had really bad allergies, primarily nasal symptoms and coughing which is making her asthma worse.  States her apt is surrounded by trees with lots of pollen and that her doors have cracks at the floor allowing pollen in the house.  She is complaint with all her medications. - Reports worsening low back pain; radiates up her back and down her legs to her knees; no bowel or bladder incontinence; not taking anything currently because of the pregnancy; has not done PT; really wants percocet back.   Review of Systems Patient Information Form: Screening and ROS  Do you feel safe in relationships? yes PHQ-2:positive - score 1 for anhedonia  Review of Symptoms  General:  Negative for nexplained weight loss, fever Skin: Negative for new or changing mole, sore that won't heal HEENT: Negative for +trouble hearing, trouble seeing, ringing in ears, mouth sores, hoarseness, change in voice, dysphagia. CV:  Negative for chest pain, dyspnea, edema, palpitations Resp: Negative for +cough,  +dyspnea, hemoptysis GI: Negative for nausea, vomiting, diarrhea, +constipation, abdominal pain, melena, hematochezia. GU: Negative for dysuria, incontinence, urinary hesitance, hematuria, vaginal or penile discharge, polyuria, sexual difficulty, lumps in testicle or breasts MSK: Negative for muscle cramps or aches, joint pain or swelling Neuro: Negative for +headaches, weakness, numbness, dizziness, passing out/fainting Psych: Negative for depression, anxiety, memory problems      Objective:   Physical Exam BP 134/85  Pulse 84  Ht 5\' 4"  (1.626 m)  Wt 163 lb (73.936 kg)  BMI 27.97 kg/m2  LMP 03/22/2012  Breastfeeding? Unknown Gen: alert, cooperative, NAD HEENT: AT/[place], sclera white, MMM, PERRL, cobblestoning and mild erythema of posterior pharynx, TMs normal bilaterally Neck: supple, no LAD CV: RRR, no murmurs Pulm: CTAB, no wheezes or rales Abd: +BS, soft, NTND Ext: no edema, 2+ DP pulses bilaterally Pelvic: normal external genitalia, normal vagina, some thin white discharge present, cervix is closed and thick, pap and gc/chlamydia collected, bimanual exam shows non-gravid size uterus, no adnexal masses or tenderness      Assessment & Plan:  I spent 40 minutes with the patient, > 50% spent in counseling the patient.

## 2012-07-22 NOTE — Patient Instructions (Addendum)
It was nice to see you! We need to check some things out.   Follow up in 1 week to go over lab and ultrasound results.  We will decide on the next step then.

## 2012-07-22 NOTE — Assessment & Plan Note (Signed)
Living situation and allergies may be contributing to flares.  Will fill out form and provide letter for [place] [facility].  Refilled medication.

## 2012-07-22 NOTE — Assessment & Plan Note (Signed)
Pap collected today.  Will check cbc and cmp as well.  Needs lipids checked, but not fasting.

## 2012-07-22 NOTE — Assessment & Plan Note (Signed)
Will give flexeril as it is pregnancy class B.  Will need PT referral.  Discussed that I do not use narcotics for back pain.  Will start NSAID if able given pregnancy issue.

## 2012-07-23 LAB — COMPREHENSIVE METABOLIC PANEL
ALT: 35 U/L (ref 0–35)
Albumin: 4.3 g/dL (ref 3.5–5.2)
CO2: 26 mEq/L (ref 19–32)
Calcium: 9.7 mg/dL (ref 8.4–10.5)
Chloride: 105 mEq/L (ref 96–112)
Glucose, Bld: 92 mg/dL (ref 70–99)
Sodium: 141 mEq/L (ref 135–145)
Total Protein: 7.3 g/dL (ref 6.0–8.3)

## 2012-07-23 LAB — CBC
Hemoglobin: 14.3 g/dL (ref 12.0–15.0)
Platelets: 250 10*3/uL (ref 150–400)
RBC: 4.66 MIL/uL (ref 3.87–5.11)
WBC: 6.4 10*3/uL (ref 4.0–10.5)

## 2012-07-28 ENCOUNTER — Encounter: Payer: Self-pay | Admitting: Emergency Medicine

## 2012-07-29 ENCOUNTER — Telehealth: Payer: Self-pay | Admitting: *Deleted

## 2012-07-29 ENCOUNTER — Ambulatory Visit (HOSPITAL_COMMUNITY)
Admission: RE | Admit: 2012-07-29 | Discharge: 2012-07-29 | Disposition: A | Discharge status: Home / Self Care | Payer: Medicaid Other | Source: Ambulatory Visit | Attending: Family Medicine | Admitting: Family Medicine

## 2012-07-29 DIAGNOSIS — O98519 Other viral diseases complicating pregnancy, unspecified trimester: Secondary | ICD-10-CM | POA: Insufficient documentation

## 2012-07-29 DIAGNOSIS — Z8751 Personal history of pre-term labor: Secondary | ICD-10-CM | POA: Insufficient documentation

## 2012-07-29 DIAGNOSIS — O039 Complete or unspecified spontaneous abortion without complication: Secondary | ICD-10-CM

## 2012-07-29 DIAGNOSIS — O09529 Supervision of elderly multigravida, unspecified trimester: Secondary | ICD-10-CM | POA: Insufficient documentation

## 2012-07-29 DIAGNOSIS — A6000 Herpesviral infection of urogenital system, unspecified: Secondary | ICD-10-CM | POA: Insufficient documentation

## 2012-07-29 DIAGNOSIS — O3680X0 Pregnancy with inconclusive fetal viability, not applicable or unspecified: Secondary | ICD-10-CM | POA: Insufficient documentation

## 2012-07-29 DIAGNOSIS — O09299 Supervision of pregnancy with other poor reproductive or obstetric history, unspecified trimester: Secondary | ICD-10-CM | POA: Insufficient documentation

## 2012-07-29 DIAGNOSIS — N912 Amenorrhea, unspecified: Secondary | ICD-10-CM

## 2012-07-29 NOTE — Telephone Encounter (Signed)
Received a call report on Ashley Jordan's ultrasound.  It showed a single large intrauterine gestational sac with no yoke sac or fetal pole seen.  Definitive for failed pregnancy.  They spoke to an attending at [facility] who recommended that patient have a repeat BHCG on Tues 08/03/12 to see if the numbers drop.  Patient is already scheduled on 08/05/2012 to follow up with Dr. Elwyn Reach.  Ashley had to leave before they called report, so they have left her a message to follow up with [place].  Will forward to Dr. Elwyn Reach for review.

## 2012-08-03 ENCOUNTER — Encounter: Payer: Self-pay | Admitting: Emergency Medicine

## 2012-08-03 ENCOUNTER — Other Ambulatory Visit: Payer: Medicaid Other

## 2012-08-05 ENCOUNTER — Ambulatory Visit: Payer: Medicaid Other | Admitting: Emergency Medicine

## 2012-08-13 ENCOUNTER — Telehealth: Payer: Self-pay | Admitting: Emergency Medicine

## 2012-08-13 ENCOUNTER — Ambulatory Visit: Payer: Medicaid Other | Admitting: Emergency Medicine

## 2012-08-13 NOTE — Telephone Encounter (Signed)
Attempted to call patient to discuss ultrasound results and have her come in for repeat b-HCG.  I was unable to leave a message on her mobile or home numbers.  She needs to have a repeat b-HCG.  She has missed several appt with me where we were going to discuss this.

## 2012-08-31 ENCOUNTER — Telehealth: Payer: Self-pay | Admitting: *Deleted

## 2012-08-31 NOTE — Telephone Encounter (Signed)
Pt calls to have refilled her albuterol inhaler. Will fwd to Md.  Jailyn Langhorst, Darlyne Russian, CMA

## 2012-09-01 MED ORDER — ALBUTEROL SULFATE HFA 108 (90 BASE) MCG/ACT IN AERS: 2.0000 | INHALATION_SPRAY | Freq: Four times a day (QID) | RESPIRATORY_TRACT | Status: DC | PRN

## 2012-09-01 NOTE — Telephone Encounter (Signed)
Albuterol prescription sent to [facility].

## 2012-11-23 ENCOUNTER — Encounter (HOSPITAL_COMMUNITY): Payer: Self-pay | Admitting: Emergency Medicine

## 2012-11-23 ENCOUNTER — Emergency Department (HOSPITAL_COMMUNITY)
Admission: EM | Admit: 2012-11-23 | Discharge: 2012-11-23 | Disposition: A | Discharge status: Home / Self Care | Payer: Medicaid Other | Attending: Emergency Medicine | Admitting: Emergency Medicine

## 2012-11-23 ENCOUNTER — Emergency Department (HOSPITAL_COMMUNITY): Payer: Medicaid Other

## 2012-11-23 DIAGNOSIS — Y9289 Other specified places as the place of occurrence of the external cause: Secondary | ICD-10-CM | POA: Insufficient documentation

## 2012-11-23 DIAGNOSIS — Z79899 Other long term (current) drug therapy: Secondary | ICD-10-CM | POA: Insufficient documentation

## 2012-11-23 DIAGNOSIS — IMO0002 Reserved for concepts with insufficient information to code with codable children: Secondary | ICD-10-CM | POA: Insufficient documentation

## 2012-11-23 DIAGNOSIS — Z87891 Personal history of nicotine dependence: Secondary | ICD-10-CM | POA: Insufficient documentation

## 2012-11-23 DIAGNOSIS — W010XXA Fall on same level from slipping, tripping and stumbling without subsequent striking against object, initial encounter: Secondary | ICD-10-CM | POA: Insufficient documentation

## 2012-11-23 DIAGNOSIS — E119 Type 2 diabetes mellitus without complications: Secondary | ICD-10-CM | POA: Insufficient documentation

## 2012-11-23 DIAGNOSIS — Y9301 Activity, walking, marching and hiking: Secondary | ICD-10-CM | POA: Insufficient documentation

## 2012-11-23 DIAGNOSIS — M25561 Pain in right knee: Secondary | ICD-10-CM

## 2012-11-23 DIAGNOSIS — Z8619 Personal history of other infectious and parasitic diseases: Secondary | ICD-10-CM | POA: Insufficient documentation

## 2012-11-23 DIAGNOSIS — J45909 Unspecified asthma, uncomplicated: Secondary | ICD-10-CM | POA: Insufficient documentation

## 2012-11-23 DIAGNOSIS — S8990XA Unspecified injury of unspecified lower leg, initial encounter: Secondary | ICD-10-CM | POA: Insufficient documentation

## 2012-11-23 MED ORDER — OXYCODONE-ACETAMINOPHEN 5-325 MG PO TABS: 1.0000 | ORAL_TABLET | Freq: Four times a day (QID) | ORAL | Status: DC | PRN

## 2012-11-23 MED ORDER — NAPROXEN 500 MG PO TABS
500.0000 mg | ORAL_TABLET | Freq: Once | ORAL | Status: AC
  Administered 2012-11-23: 500 mg via ORAL
  Filled 2012-11-23: qty 1

## 2012-11-23 MED ORDER — NAPROXEN 500 MG PO TABS: 500.0000 mg | ORAL_TABLET | Freq: Two times a day (BID) | ORAL | Status: DC

## 2012-11-23 MED ORDER — OXYCODONE-ACETAMINOPHEN 5-325 MG PO TABS
1.0000 | ORAL_TABLET | Freq: Once | ORAL | Status: AC
  Administered 2012-11-23: 1 via ORAL
  Filled 2012-11-23: qty 1

## 2012-11-23 NOTE — ED Provider Notes (Signed)
CSN: [id number]     Arrival date & time 11/23/12  1125 History   First MD Initiated Contact with Patient 11/23/12 1130     Chief Complaint  Patient presents with  . Knee Pain   (Consider location/radiation/quality/duration/timing/severity/associated sxs/prior Treatment) HPI Comments: Patient presents with a chief complaint of right knee pain.  Pain has been present since 6 AM this morning when she twisted her knee while walking down the stairs.  She reports that she had some swelling initially, but the swelling has improved with applying ice.  She has been able to ambulate since the injury, but reports increased pain with ambulation.  She has taken Ibuprofen for the pain with mild relief.  She denies numbness or tingling.  Denies prior injury to the knee.    The history is provided by the patient.    Past Medical History  Diagnosis Date  . PIH (pregnancy induced hypertension) 2003  . Asthma   . Genital herpes   . Diabetes mellitus without complication    No past surgical history on file. Family History  Problem Relation Age of Onset  . Hypertension Mother   . Diabetes Father    History  Substance Use Topics  . Smoking status: Former Smoker -- 0.30 packs/day    Types: Cigarettes  . Smokeless tobacco: Never Used  . Alcohol Use: Yes     Comment: social   OB History   Grav Para Term Preterm Abortions TAB SAB Ect Mult Living   3 1 0 1 1  1   1      Review of Systems  Musculoskeletal:       Right knee pain    Allergies  Septra  Home Medications   Current Outpatient Rx  Name  Route  Sig  Dispense  Refill  . albuterol (PROVENTIL HFA;VENTOLIN HFA) 108 (90 BASE) MCG/ACT inhaler   Inhalation   Inhale 2 puffs into the lungs every 6 (six) hours as needed for wheezing.   1 Inhaler   2   . cetirizine (ZYRTEC) 10 MG tablet   Oral   Take 1 tablet (10 mg total) by mouth daily.   30 tablet   11   . fluticasone (FLONASE) 50 MCG/ACT nasal spray      2 sprays per nostril  once daily. Disp qs x 1 month         . Fluticasone-Salmeterol (ADVAIR) 500-50 MCG/DOSE AEPB   Inhalation   Inhale 1 puff into the lungs every 12 (twelve) hours.   60 each   6   . hydrOXYzine (VISTARIL) 25 MG capsule   Oral   Take 1 capsule (25 mg total) by mouth 3 (three) times daily as needed. Take 1 capsule by mouth at bedtime for allergic itching.   30 capsule   3   . ibuprofen (ADVIL,MOTRIN) 200 MG tablet   Oral   Take 200-400 mg by mouth every 6 (six) hours as needed for pain.         . valACYclovir (VALTREX) 1000 MG tablet   Oral   Take 1 tablet (1,000 mg total) by mouth 2 (two) times daily. For 3 days at start of outbreak   20 tablet   0    BP 124/78  Pulse 98  Temp(Src) 99.1 F (37.3 C) (Oral)  Resp 18  Wt 157 lb (71.215 kg)  BMI 26.94 kg/m2  SpO2 97%  LMP 10/27/2012 Physical Exam  Nursing note and vitals reviewed. Constitutional: She appears well-developed and well-nourished.  HENT:  Head: Normocephalic and atraumatic.  Cardiovascular: Normal rate, regular rhythm and normal heart sounds.   Pulses:      Dorsalis pedis pulses are 2+ on the right side, and 2+ on the left side.  Pulmonary/Chest: Effort normal and breath sounds normal.  Musculoskeletal:       Right knee: She exhibits bony tenderness. She exhibits no swelling, no effusion, no ecchymosis, no deformity, no laceration, no erythema, no LCL laxity and no MCL laxity. Tenderness found. Medial joint line and lateral joint line tenderness noted.  Pain with ROM of the right knee  Neurological: She is alert. No sensory deficit.  Skin: Skin is warm and dry. No erythema.  Psychiatric: She has a normal mood and affect.    ED Course  Procedures (including critical care time) Labs Review Labs Reviewed - No data to display Imaging Review Dg Knee Complete 4 Views Right  11/23/2012   CLINICAL DATA:  Twisting injury. Knee pain.  EXAM: RIGHT KNEE - COMPLETE 4+ VIEW  COMPARISON:  None.  FINDINGS: There is  no evidence of fracture, dislocation, or joint effusion. There is no evidence of arthropathy or other focal bone abnormality. Soft tissues are unremarkable.  IMPRESSION: Negative.   Electronically Signed   By: Charlett Nose M.D.   On: 11/23/2012 12:07    MDM  No diagnosis found. Patient presenting with right knee pain after twisting her knee while going down the stairs earlier today.  Xray negative.  Patient neurovascularly intact.  Patient discharged home with knee immobilizer and crutches.  Patient given referral to Orthopedics.    Pascal Lux [place], PA-C 11/24/12 2050

## 2012-11-23 NOTE — ED Notes (Signed)
Pt states that she tripped going down the stairs and twisted her right knee.  C/o knee pain since it happened at 6 am today.

## 2012-11-25 NOTE — ED Provider Notes (Signed)
Medical screening examination/treatment/procedure(s) were performed by non-physician practitioner and as supervising physician I was immediately available for consultation/collaboration.    Aleli Navedo D Erikka Follmer, MD 11/25/12 2051 

## 2012-11-26 ENCOUNTER — Encounter: Payer: Self-pay | Admitting: Family Medicine

## 2012-11-26 ENCOUNTER — Ambulatory Visit (INDEPENDENT_AMBULATORY_CARE_PROVIDER_SITE_OTHER): Payer: Medicaid Other | Admitting: Family Medicine

## 2012-11-26 VITALS — BP 130/82 | HR 88

## 2012-11-26 DIAGNOSIS — Z5189 Encounter for other specified aftercare: Secondary | ICD-10-CM

## 2012-11-26 DIAGNOSIS — S8391XA Sprain of unspecified site of right knee, initial encounter: Secondary | ICD-10-CM | POA: Insufficient documentation

## 2012-11-26 DIAGNOSIS — S8391XD Sprain of unspecified site of right knee, subsequent encounter: Secondary | ICD-10-CM

## 2012-11-26 MED ORDER — OXYCODONE-ACETAMINOPHEN 5-325 MG PO TABS: 1.0000 | ORAL_TABLET | Freq: Four times a day (QID) | ORAL | Status: DC | PRN

## 2012-11-26 NOTE — Patient Instructions (Signed)
Thank you for coming in, today!  For your knee pain:  I will refill a short course of Percocet.  You can continue to take ibuprofen or naproxyn to help with inflammation.  Continue to use ice, rest, elevation, and your brace.  AT LEAST twice a day, work your knee through a full range of motion.  I put in a referral to physical therapy. If you don't hear from them in a week, call them.  Their number is [phone].  Make sure to follow up with orthopedics.  Make an appointment to see Dr. Piedad Climes Elwyn Reach) in 3-4 week. Please feel free to call with any questions or concerns at any time, at [phone]. --Dr. Casper Harrison

## 2012-11-26 NOTE — Assessment & Plan Note (Signed)
-  Significant pain with manipulation for exam and very pronounced affect/verbal reaction to manipulation for exam, though pt has not taken any Percocet this morning due to having to drive. -No red flags to suggest neurovascular compromise. No obvious deformity or effusion to suggest fracture or other severe injury.  -Continue supportive measures (ice, rest, elevation, bracing, crutches). Strongly recommended ROM exercise to prevent worsening muscle spasm, etc.  -Instructed pt to f/u with orthopedics as previously referred, and placed referral for PT (pt is a veteran but has Medicaid) -Suggested she speak directly to [facility] facility to discuss whether she would have to pay out of pocket, and if she does, she states she will try going to the [place] -Continue Aleve PRN, and Rx given for Percocet #45 with the explanation that this will NOT be a chronic medication. Considered muscle relaxer, but pt states she prefers not to take sedating medications. -F/u with Dr. Piedad Climes as needed.

## 2012-11-26 NOTE — Progress Notes (Signed)
  Subjective:    Patient ID: Ashley Jordan, female    DOB: April 28, 1970, 42 y.o.   MRN: [id number]  HPI: Pt seen for ED f/u of knee injury on 9/16. Pt states she fell down the stairs at home after the handrail pulled out of the wall and "wrenched and twisted" her knee, badly. Pt went to the ED and had xray done which showed no fracture, dislocation, or effusion/hermarthrosis. She was treated there with Percocet and Aleve, given a leg brace, referred to orthopedics, and told to f/u here. Since then, pt reports continued pain in her knee, worse with standing/walking and bending the knee, especially on the inner side. She is able to walk with crutches and has been using ice and keeping her leg elevated, which has helped. 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  Of note, pt has contacted orthopedics and was told she can't be seen for 2-3 weeks. She is interested in physical therapy. She also requests refill of Percocet (she was d/ced from the ED with only 15 tablets).  Review of Systems: As above. Does endorse a fever to "something like 101" last night that lasted for a few hours; denies chills, N/V, or other systemic symptoms.     Objective:   Physical Exam BP 130/82  Pulse 88  LMP 10/27/2012  Gen: well-appearing adult female in NAD, pleasant but with a very pronounced affective reaction to pain with exam MSK: right knee with severe pain on active and passive ROM (unable to completely examine for full ROM due to pain)  No significant swelling or obvious deformity or effusion compared to left knee  Significant tenderness along lateral and medial joint lines with increased pain on both varus and valgus stress  Patella not displaced, distal pulses intact, distal sensation and strength intact (dorsi- and plantar flexion) Pt able to stand/walk unassisted other than using crutches      Assessment & Plan:

## 2012-12-07 ENCOUNTER — Ambulatory Visit: Payer: Medicaid Other | Attending: Family Medicine | Admitting: Physical Therapy

## 2012-12-17 ENCOUNTER — Ambulatory Visit: Payer: Medicaid Other | Admitting: Emergency Medicine

## 2013-03-23 ENCOUNTER — Telehealth: Payer: Self-pay | Admitting: Emergency Medicine

## 2013-03-23 ENCOUNTER — Other Ambulatory Visit: Payer: Self-pay | Admitting: Emergency Medicine

## 2013-03-23 MED ORDER — VALACYCLOVIR HCL 1 G PO TABS: 1000.0000 mg | ORAL_TABLET | Freq: Two times a day (BID) | ORAL | Status: DC

## 2013-03-23 MED ORDER — HYDROXYZINE PAMOATE 25 MG PO CAPS: 25.0000 mg | ORAL_CAPSULE | Freq: Every day | ORAL | Status: DC | PRN

## 2013-03-23 NOTE — Telephone Encounter (Signed)
[facility] [address]

## 2013-03-23 NOTE — Telephone Encounter (Signed)
Forward to PCP for refill request.Ashley Jordan  

## 2013-03-23 NOTE — Telephone Encounter (Signed)
Needs valtrex refilled

## 2013-07-05 ENCOUNTER — Encounter (HOSPITAL_COMMUNITY): Payer: Self-pay | Admitting: Emergency Medicine

## 2013-07-05 ENCOUNTER — Emergency Department (HOSPITAL_COMMUNITY)
Admission: EM | Admit: 2013-07-05 | Discharge: 2013-07-05 | Disposition: A | Discharge status: Home / Self Care | Payer: Medicaid Other | Attending: Emergency Medicine | Admitting: Emergency Medicine

## 2013-07-05 DIAGNOSIS — M79662 Pain in left lower leg: Secondary | ICD-10-CM

## 2013-07-05 DIAGNOSIS — W108XXA Fall (on) (from) other stairs and steps, initial encounter: Secondary | ICD-10-CM | POA: Insufficient documentation

## 2013-07-05 DIAGNOSIS — IMO0002 Reserved for concepts with insufficient information to code with codable children: Secondary | ICD-10-CM | POA: Insufficient documentation

## 2013-07-05 DIAGNOSIS — Y9389 Activity, other specified: Secondary | ICD-10-CM | POA: Insufficient documentation

## 2013-07-05 DIAGNOSIS — Z8619 Personal history of other infectious and parasitic diseases: Secondary | ICD-10-CM | POA: Insufficient documentation

## 2013-07-05 DIAGNOSIS — Y929 Unspecified place or not applicable: Secondary | ICD-10-CM | POA: Insufficient documentation

## 2013-07-05 DIAGNOSIS — Z79899 Other long term (current) drug therapy: Secondary | ICD-10-CM | POA: Insufficient documentation

## 2013-07-05 DIAGNOSIS — Z87891 Personal history of nicotine dependence: Secondary | ICD-10-CM | POA: Insufficient documentation

## 2013-07-05 DIAGNOSIS — T148XXA Other injury of unspecified body region, initial encounter: Secondary | ICD-10-CM

## 2013-07-05 DIAGNOSIS — J45909 Unspecified asthma, uncomplicated: Secondary | ICD-10-CM | POA: Insufficient documentation

## 2013-07-05 DIAGNOSIS — E119 Type 2 diabetes mellitus without complications: Secondary | ICD-10-CM | POA: Insufficient documentation

## 2013-07-05 MED ORDER — TRAMADOL HCL 50 MG PO TABS
50.0000 mg | ORAL_TABLET | Freq: Once | ORAL | Status: AC
  Administered 2013-07-05: 50 mg via ORAL
  Filled 2013-07-05: qty 1

## 2013-07-05 MED ORDER — TRAMADOL HCL 50 MG PO TABS: 50.0000 mg | ORAL_TABLET | Freq: Four times a day (QID) | ORAL | Status: DC | PRN

## 2013-07-05 MED ORDER — HYDROCODONE-ACETAMINOPHEN 5-325 MG PO TABS: 1.0000 | ORAL_TABLET | ORAL | Status: DC | PRN

## 2013-07-05 NOTE — ED Notes (Signed)
Pt states that the railing on her stairs in her townhome is bad and she fell yesterday down 6 stairs. Pt c/o left leg pain. Pt tried taking flexeril, sports muscle rub (like icy hot), heating pad. But pain is still hurting.

## 2013-07-05 NOTE — ED Provider Notes (Signed)
CSN: [id number]     Arrival date & time 07/05/13  1731 History  This chart was scribed for non-physician practitioner, Earley FavorGail Mitcheal Sweetin, NP, working with Raeford RazorStephen Kohut, MD by Smiley HousemanFallon Davis, ED Scribe. This patient was seen in room WTR9/WTR9 and the patient's care was started at 8:18 PM.  Chief Complaint  Patient presents with  . Fall  . Leg Pain   Patient is a 43 y.o. female presenting with leg pain. The history is provided by the patient. No language interpreter was used.  Leg Pain Location:  Leg Time since incident:  48 hours Injury: yes   Leg location:  L leg Pain details:    Quality:  Aching   Radiates to:  Does not radiate   Severity:  Moderate   Onset quality:  Gradual   Duration:  48 hours   Timing:  Constant   Progression:  Worsening Chronicity:  New Dislocation: no   Prior injury to area:  No Relieved by:  Nothing Worsened by:  Activity Ineffective treatments:  Rest Associated symptoms: no back pain, no fever and no neck pain    HPI Comments: Ashley Jordan is a 43 y.o. female who presents to the Emergency Department complaining of constant worsening left leg pain that started after she fell down a flight of stairs about 48 hours ago. She states she fell down about 6 stairs.  She states she landed on her left side and tailbone.  Pt states the pain is worse with flexion and extension of her left leg.  Pt denies bowel or bladder incontinence.  She denies tingling and numbness in her left leg.  She states she has tried flexeril, sport medicine cream, heating pad, and elevating her leg.    Past Medical History  Diagnosis Date  . Asthma   . Genital herpes   . Diabetes mellitus without complication    Past Surgical History  Procedure Laterality Date  . Induced abortion     Family History  Problem Relation Age of Onset  . Hypertension Mother   . Diabetes Father    History  Substance Use Topics  . Smoking status: Former Smoker -- 0.30 packs/day    Types: Cigarettes  .  Smokeless tobacco: Never Used  . Alcohol Use: Yes     Comment: social   OB History   Grav Para Term Preterm Abortions TAB SAB Ect Mult Living   3 1 0 1 1  1   1      Review of Systems  Constitutional: Negative for fever and chills.  Respiratory: Negative for shortness of breath.   Cardiovascular: Negative for chest pain.  Gastrointestinal: Negative for nausea, vomiting, abdominal pain and diarrhea.  Musculoskeletal: Negative for back pain, gait problem, joint swelling, neck pain and neck stiffness.       Left leg pain  Skin: Negative for color change, rash and wound.  Neurological: Negative for weakness, numbness and headaches.  Psychiatric/Behavioral: Negative for behavioral problems and confusion.  All other systems reviewed and are negative.   Allergies  Septra  Home Medications   Prior to Admission medications   Medication Sig Start Date End Date Taking? Authorizing Provider  albuterol (PROVENTIL HFA;VENTOLIN HFA) 108 (90 BASE) MCG/ACT inhaler Inhale 2 puffs into the lungs every 6 (six) hours as needed for wheezing. 09/01/12  Yes Charm RingsErin J Honig, MD  cetirizine (ZYRTEC) 10 MG tablet Take 1 tablet (10 mg total) by mouth daily. 12/31/11  Yes Charm RingsErin J Honig, MD  cyclobenzaprine (FLEXERIL) 10  MG tablet Take 10 mg by mouth once.   Yes Historical Provider, MD  fluticasone (FLONASE) 50 MCG/ACT nasal spray Place 2 sprays into both nostrils daily.  07/10/10  Yes Cat Ta, MD  Fluticasone-Salmeterol (ADVAIR) 500-50 MCG/DOSE AEPB Inhale 1 puff into the lungs every 12 (twelve) hours. 07/22/12  Yes Charm RingsErin J Honig, MD  hydrOXYzine (VISTARIL) 25 MG capsule Take 1 capsule (25 mg total) by mouth daily as needed for anxiety or itching. 03/23/13  Yes Charm RingsErin J Honig, MD  ibuprofen (ADVIL,MOTRIN) 200 MG tablet Take 400 mg by mouth every 6 (six) hours as needed for pain.    Yes Historical Provider, MD  valACYclovir (VALTREX) 1000 MG tablet Take 1 tablet (1,000 mg total) by mouth 2 (two) times daily. For 3 days at  start of outbreak 03/23/13  Yes Charm RingsErin J Honig, MD   Triage Vitals: BP 103/86  Pulse 95  Temp(Src) 98 F (36.7 C) (Oral)  Resp 16  SpO2 98%  LMP 05/27/2013  Physical Exam  Nursing note and vitals reviewed. Constitutional: She appears well-developed and well-nourished.  HENT:  Head: Normocephalic.  Eyes: Pupils are equal, round, and reactive to light.  Neck: Normal range of motion.  Cardiovascular: Normal rate.   Pulmonary/Chest: Effort normal.  Musculoskeletal: She exhibits tenderness. She exhibits no edema.       Legs: Neurological: She is alert.  Skin: Skin is warm. No rash noted. No erythema.    ED Course  Procedures (including critical care time) DIAGNOSTIC STUDIES: Oxygen Saturation is 98% on RA, normal by my interpretation.    COORDINATION OF CARE: 8:24 PM-Patient informed of current plan of treatment and evaluation and agrees with plan.     Labs Review Labs Reviewed - No data to display  Imaging Review No results found.   EKG Interpretation None      MDM  No sign of compartment symdrome calf muscle is soft and tender to touch, distal pulses intact, color, sensation equal  Final diagnoses:  Pain of left calf  Muscle strain         Arman FilterGail K Davine Sweney, NP 07/05/13 2038

## 2013-07-05 NOTE — ED Notes (Signed)
Pt ambulatory to exam room with steady gait.  

## 2013-07-05 NOTE — Discharge Instructions (Signed)
Cryotherapy °Cryotherapy means treatment with cold. Ice or gel packs can be used to reduce both pain and swelling. Ice is the most helpful within the first 24 to 48 hours after an injury or flareup from overusing a muscle or joint. Sprains, strains, spasms, burning pain, shooting pain, and aches can all be eased with ice. Ice can also be used when recovering from surgery. Ice is effective, has very few side effects, and is safe for most people to use. °PRECAUTIONS  °Ice is not a safe treatment option for people with: °· Raynaud's phenomenon. This is a condition affecting small blood vessels in the extremities. Exposure to cold may cause your problems to return. °· Cold hypersensitivity. There are many forms of cold hypersensitivity, including: °· Cold urticaria. Red, itchy hives appear on the skin when the tissues begin to warm after being iced. °· Cold erythema. This is a red, itchy rash caused by exposure to cold. °· Cold hemoglobinuria. Red blood cells break down when the tissues begin to warm after being iced. The hemoglobin that carry oxygen are passed into the urine because they cannot combine with blood proteins fast enough. °· Numbness or altered sensitivity in the area being iced. °If you have any of the following conditions, do not use ice until you have discussed cryotherapy with your caregiver: °· Heart conditions, such as arrhythmia, angina, or chronic heart disease. °· High blood pressure. °· Healing wounds or open skin in the area being iced. °· Current infections. °· Rheumatoid arthritis. °· Poor circulation. °· Diabetes. °Ice slows the blood flow in the region it is applied. This is beneficial when trying to stop inflamed tissues from spreading irritating chemicals to surrounding tissues. However, if you expose your skin to cold temperatures for too long or without the proper protection, you can damage your skin or nerves. Watch for signs of skin damage due to cold. °HOME CARE INSTRUCTIONS °Follow  these tips to use ice and cold packs safely. °· Place a dry or damp towel between the ice and skin. A damp towel will cool the skin more quickly, so you may need to shorten the time that the ice is used. °· For a more rapid response, add gentle compression to the ice. °· Ice for no more than 10 to 20 minutes at a time. The bonier the area you are icing, the less time it will take to get the benefits of ice. °· Check your skin after 5 minutes to make sure there are no signs of a poor response to cold or skin damage. °· Rest 20 minutes or more in between uses. °· Once your skin is numb, you can end your treatment. You can test numbness by very lightly touching your skin. The touch should be so light that you do not see the skin dimple from the pressure of your fingertip. When using ice, most people will feel these normal sensations in this order: cold, burning, aching, and numbness. °· Do not use ice on someone who cannot communicate their responses to pain, such as small children or people with dementia. °HOW TO MAKE AN ICE PACK °Ice packs are the most common way to use ice therapy. Other methods include ice massage, ice baths, and cryo-sprays. Muscle creams that cause a cold, tingly feeling do not offer the same benefits that ice offers and should not be used as a substitute unless recommended by your caregiver. °To make an ice pack, do one of the following: °· Place crushed ice or   a bag of frozen vegetables in a sealable plastic bag. Squeeze out the excess air. Place this bag inside another plastic bag. Slide the bag into a pillowcase or place a damp towel between your skin and the bag.  Mix 3 parts water with 1 part rubbing alcohol. Freeze the mixture in a sealable plastic bag. When you remove the mixture from the freezer, it will be slushy. Squeeze out the excess air. Place this bag inside another plastic bag. Slide the bag into a pillowcase or place a damp towel between your skin and the bag. SEEK MEDICAL  CARE IF:  You develop white spots on your skin. This may give the skin a blotchy (mottled) appearance.  Your skin turns blue or pale.  Your skin becomes waxy or hard.  Your swelling gets worse. MAKE SURE YOU:   Understand these instructions.  Will watch your condition.  Will get help right away if you are not doing well or get worse. Document Released: 10/21/2010 Document Revised: 05/19/2011 Document Reviewed: 10/21/2010 [facility] Patient Information 2014 [place]. Return if pan is not getting better, your calf gets hard and your foot become cool and pale

## 2013-07-07 NOTE — ED Provider Notes (Signed)
Medical screening examination/treatment/procedure(s) were performed by non-physician practitioner and as supervising physician I was immediately available for consultation/collaboration.   EKG Interpretation None       Raeford RazorStephen Novella Abraha, MD 07/07/13 1323

## 2013-07-14 ENCOUNTER — Ambulatory Visit (INDEPENDENT_AMBULATORY_CARE_PROVIDER_SITE_OTHER): Payer: Medicaid Other | Admitting: Emergency Medicine

## 2013-07-14 VITALS — BP 125/90 | HR 75 | Ht 64.0 in | Wt 166.4 lb

## 2013-07-14 DIAGNOSIS — IMO0002 Reserved for concepts with insufficient information to code with codable children: Secondary | ICD-10-CM

## 2013-07-14 DIAGNOSIS — M25569 Pain in unspecified knee: Secondary | ICD-10-CM

## 2013-07-14 DIAGNOSIS — M25562 Pain in left knee: Secondary | ICD-10-CM | POA: Insufficient documentation

## 2013-07-14 DIAGNOSIS — S76319A Strain of muscle, fascia and tendon of the posterior muscle group at thigh level, unspecified thigh, initial encounter: Secondary | ICD-10-CM

## 2013-07-14 MED ORDER — OXYCODONE-ACETAMINOPHEN 5-325 MG PO TABS: 1.0000 | ORAL_TABLET | Freq: Three times a day (TID) | ORAL | Status: DC | PRN

## 2013-07-14 MED ORDER — PREDNISONE 50 MG PO TABS: ORAL_TABLET | ORAL | Status: AC

## 2013-07-14 NOTE — Assessment & Plan Note (Signed)
After a fall in her home. No obvious joint laxity on exam but some soft tissue swelling over the medial aspect. Will check x-rays today. Scheduled ibuprofen, percocet prn, and will do a prednisone burst. F/u in 2 weeks.  If no improvement, will do MRI.

## 2013-07-14 NOTE — Patient Instructions (Signed)
It was nice to see you!  You likely have a pulled hamstring and potentially something in your knee.  Please get the x-ray of you knee in the next day or two.  [facility]  Make sure you are taking the brace off at least 2-3 times a day and gently moving your knee.  Take ibuprofen 3 times a day. Use percocet every 8 hours as needed. Take prednisone 1 pill daily for 5 days.  Please have the housing authority send me the medical paperwork again.  Follow up in 2 weeks.

## 2013-07-14 NOTE — Assessment & Plan Note (Signed)
Secondary to fall. Discussed symptomatic care. No evidence for rupture. F/u in 2 weeks.

## 2013-07-14 NOTE — Progress Notes (Signed)
   Subjective:    Patient ID: Ashley Jordan, female    DOB: [birth year], 43 y.o.   MRN: [id number]  HPI Ashley Jordan is here for ER followup for left knee pain.  She reports falling down the stairs at home approximately 2 weeks ago. She states that the railing in the wall came out. She fell down 6-7 stairs sliding down mostly on her left buttock. She was seen in the about one week ago for pain in her left leg. At times she was diagnosed with likely muscle strain and started on Norco and ibuprofen. She's been taking ibuprofen fairly regularly. The Norco made her itch so she has not been taking that. She reports continued pain primarily in the posterior knee. Worse with flexion and extension of the knee. Also has diffuse pain around the knee. Pain will sometimes go up the back of her thigh into her buttock. And she does have intermittent episodes of tingling in her toes. She is wearing a knee immobilizer, but states she does take it off several times a day.  Current Outpatient Prescriptions on File Prior to Visit  Medication Sig Dispense Refill  . albuterol (PROVENTIL HFA;VENTOLIN HFA) 108 (90 BASE) MCG/ACT inhaler Inhale 2 puffs into the lungs every 6 (six) hours as needed for wheezing.  1 Inhaler  2  . cetirizine (ZYRTEC) 10 MG tablet Take 1 tablet (10 mg total) by mouth daily.  30 tablet  11  . cyclobenzaprine (FLEXERIL) 10 MG tablet Take 10 mg by mouth once.      . fluticasone (FLONASE) 50 MCG/ACT nasal spray Place 2 sprays into both nostrils daily.       . Fluticasone-Salmeterol (ADVAIR) 500-50 MCG/DOSE AEPB Inhale 1 puff into the lungs every 12 (twelve) hours.  60 each  6  . hydrOXYzine (VISTARIL) 25 MG capsule Take 1 capsule (25 mg total) by mouth daily as needed for anxiety or itching.  30 capsule  3  . ibuprofen (ADVIL,MOTRIN) 200 MG tablet Take 400 mg by mouth every 6 (six) hours as needed for pain.       . valACYclovir (VALTREX) 1000 MG tablet Take 1 tablet (1,000 mg total) by mouth 2 (two)  times daily. For 3 days at start of outbreak  30 tablet  0   No current facility-administered medications on file prior to visit.    I have reviewed and updated the following as appropriate: allergies and current medications SHx: former smoker   Review of Systems See HPI    Objective:   Physical Exam BP 125/90  Pulse 75  Ht 5\' 4"  (1.626 m)  Wt 166 lb 6.4 oz (75.479 kg)  BMI 28.55 kg/m2  LMP 05/27/2013 Gen: alert, cooperative, NAD, left knee in immobilizer Left knee: soft tissue swelling over medial aspect, no joint effusion, no erythema; diffusely tender to palpation but worse in posterior medial knee; MCL, LCL, ACL, PCL intact; unable to do McMurrays given her diffuse pain Left buttock: tender to palpation over mid buttock Neuro: strength testing very limited secondary to pain     Assessment & Plan:

## 2013-07-28 ENCOUNTER — Ambulatory Visit: Payer: Medicaid Other | Admitting: Emergency Medicine

## 2013-08-03 ENCOUNTER — Ambulatory Visit (INDEPENDENT_AMBULATORY_CARE_PROVIDER_SITE_OTHER): Payer: Medicaid Other | Admitting: Emergency Medicine

## 2013-08-03 ENCOUNTER — Ambulatory Visit (HOSPITAL_COMMUNITY)
Admission: RE | Admit: 2013-08-03 | Discharge: 2013-08-03 | Disposition: A | Discharge status: Home / Self Care | Payer: Medicaid Other | Source: Ambulatory Visit | Attending: Family Medicine | Admitting: Family Medicine

## 2013-08-03 ENCOUNTER — Encounter: Payer: Self-pay | Admitting: Emergency Medicine

## 2013-08-03 VITALS — BP 138/96 | HR 91 | Ht 64.0 in | Wt 167.0 lb

## 2013-08-03 DIAGNOSIS — M25569 Pain in unspecified knee: Secondary | ICD-10-CM | POA: Insufficient documentation

## 2013-08-03 DIAGNOSIS — M25562 Pain in left knee: Secondary | ICD-10-CM

## 2013-08-03 MED ORDER — HYDROXYZINE PAMOATE 50 MG PO CAPS: 50.0000 mg | ORAL_CAPSULE | Freq: Every day | ORAL | Status: DC | PRN

## 2013-08-03 MED ORDER — OXYCODONE-ACETAMINOPHEN 5-325 MG PO TABS: 1.0000 | ORAL_TABLET | Freq: Three times a day (TID) | ORAL | Status: DC | PRN

## 2013-08-03 MED ORDER — HYDROXYZINE PAMOATE 25 MG PO CAPS
25.0000 mg | ORAL_CAPSULE | Freq: Every day | ORAL | Status: DC | PRN
Start: 2013-08-03 — End: 2013-08-03

## 2013-08-03 NOTE — Patient Instructions (Signed)
It was nice to see you! I'm glad your knee is improving.  You should hear from physical therapy in the next week. As the swelling gets out of your knee, the pain and numbness will improve.  Follow up as needed.

## 2013-08-03 NOTE — Assessment & Plan Note (Signed)
Improving. Continue elevation and epsom soaks. Additional 20 percocet given today.  Discussed that this will likely be the last prescription given. Will put in referral for PT. F/u as needed.

## 2013-08-03 NOTE — Progress Notes (Signed)
   Subjective:    Patient ID: Ashley Jordan, female    DOB: [birth year], 43 y.o.   MRN: [id number]  HPI Wakisha Matsen Woulfe is here for followup left knee pain.  She states the left knee pain is much better. She still has a little bit of soreness in the posterior aspect of the knee. She is now wearing a brace, but no longer using crutches or the knee immobilizer. She states the back of her knee feels swollen. She states she does have some numbness in her little toes that goes up to her knee along the lateral leg. This comes and goes.  Current Outpatient Prescriptions on File Prior to Visit  Medication Sig Dispense Refill  . albuterol (PROVENTIL HFA;VENTOLIN HFA) 108 (90 BASE) MCG/ACT inhaler Inhale 2 puffs into the lungs every 6 (six) hours as needed for wheezing.  1 Inhaler  2  . cetirizine (ZYRTEC) 10 MG tablet Take 1 tablet (10 mg total) by mouth daily.  30 tablet  11  . cyclobenzaprine (FLEXERIL) 10 MG tablet Take 10 mg by mouth once.      . fluticasone (FLONASE) 50 MCG/ACT nasal spray Place 2 sprays into both nostrils daily.       . Fluticasone-Salmeterol (ADVAIR) 500-50 MCG/DOSE AEPB Inhale 1 puff into the lungs every 12 (twelve) hours.  60 each  6  . ibuprofen (ADVIL,MOTRIN) 200 MG tablet Take 400 mg by mouth every 6 (six) hours as needed for pain.       . predniSONE (DELTASONE) 50 MG tablet Take 1 tablet daily for 5 days.  5 tablet  0  . valACYclovir (VALTREX) 1000 MG tablet Take 1 tablet (1,000 mg total) by mouth 2 (two) times daily. For 3 days at start of outbreak  30 tablet  0   No current facility-administered medications on file prior to visit.    I have reviewed and updated the following as appropriate: allergies and current medications SHx: former smoker  Health Maintenance: pap due 07/2015   Review of Systems See HPI    Objective:   Physical Exam BP 138/96  Pulse 91  Ht 5\' 4"  (1.626 m)  Wt 167 lb (75.751 kg)  BMI 28.65 kg/m2  LMP 07/27/2013 Gen: alert, cooperative,  NAD Left knee: no erythema or obvious swelling; no joint effusion appreciated; some fullness to the posterior aspect of the knee; no point tenderness Neuro: normal strength in left leg and foot; some subjective numbness in lateral toes and lateral leg     Assessment & Plan:

## 2013-09-20 ENCOUNTER — Ambulatory Visit (INDEPENDENT_AMBULATORY_CARE_PROVIDER_SITE_OTHER): Payer: Medicaid Other | Admitting: Family Medicine

## 2013-09-20 ENCOUNTER — Encounter: Payer: Self-pay | Admitting: Family Medicine

## 2013-09-20 VITALS — BP 133/85 | HR 98 | Temp 98.1°F | Wt 174.0 lb

## 2013-09-20 DIAGNOSIS — N912 Amenorrhea, unspecified: Secondary | ICD-10-CM

## 2013-09-20 DIAGNOSIS — M545 Low back pain, unspecified: Secondary | ICD-10-CM

## 2013-09-20 DIAGNOSIS — R300 Dysuria: Secondary | ICD-10-CM

## 2013-09-20 LAB — POCT URINALYSIS DIPSTICK
BILIRUBIN UA: NEGATIVE
Blood, UA: NEGATIVE
GLUCOSE UA: NEGATIVE
Ketones, UA: NEGATIVE
Leukocytes, UA: NEGATIVE
Nitrite, UA: NEGATIVE
Protein, UA: NEGATIVE
SPEC GRAV UA: 1.025
Urobilinogen, UA: 0.2
pH, UA: 6.5

## 2013-09-20 LAB — POCT URINE PREGNANCY: Preg Test, Ur: NEGATIVE

## 2013-09-20 MED ORDER — CEPHALEXIN 500 MG PO CAPS: 500.0000 mg | ORAL_CAPSULE | Freq: Three times a day (TID) | ORAL | Status: DC

## 2013-09-20 MED ORDER — KETOROLAC TROMETHAMINE 30 MG/ML IJ SOLN
30.0000 mg | Freq: Once | INTRAMUSCULAR | Status: AC
  Administered 2013-09-20: 30 mg via INTRAMUSCULAR

## 2013-09-20 MED ORDER — OXYCODONE-ACETAMINOPHEN 5-325 MG PO TABS: 1.0000 | ORAL_TABLET | Freq: Three times a day (TID) | ORAL | Status: DC | PRN

## 2013-09-20 NOTE — Progress Notes (Signed)
   Subjective:    Patient ID: Ashley Jordan, female    DOB: [birth year], 43 y.o.   MRN: [id number]  HPI: Pt presents to clinic for SDA for back pain and urinary symptoms as well as recent abnormal menses. Pt reports bilateral lower back pain worse with movement, which sometimes doubles her over with pain. She also has dysuria, increased frequency / urgency. These symptoms have been present for 3-4 days. She vomited 2 days ago and had fever to 102 with chills last night. She has been drinking extra water and cranberry juice, and taking ibuprofen which has helped only a little. She has not had any kidney stones in the past but has had UTI's and was told at one point by a doctor that they were concerned she "might've had a kidney stone." She denies frank blood in her urine.  Of note, pt's last menstrual period was 6 days late and only half as long as normal. She is sexually active with her fiance and does not use contraception. She is concerned she could be pregnant. She denies abnormal vaginal discharge but does state she has "more than usual." She complains that her breasts feel "hard" and slightly swollen, as well.  Review of Systems: As above.      Objective:   Physical Exam BP 133/85  Pulse 98  Temp(Src) 98.1 F (36.7 C) (Oral)  Wt 174 lb (78.926 kg)  LMP 09/02/2013 Gen: well-appearing adult female in NAD HEENT: [place]/AT, EOMI, MMM Cardio: RRR, no murmur appreciated Pulm: CTAB, no wheezes Abd: soft, nontender, BS+ MSK: marked bilateral CVA tenderness, worse on right, with radiation to right flank Ext: warm, well-perfused, no LE edema  UA: normal Urine pregnancy test: NEGATIVE     Assessment & Plan:  43yo female with dysuria and "amenorrhea," but otherwise appears well. - exam and history suggest kidney stone vs possible UTI or early pyelonephritis - UA negative --> culture sent today - urine pregnancy negative; possible anovulatory cycle  Plan: - Empiric abx to cover for UTI /  pyelo while culture pending - if culture negative, will plan to stop abx early - advised good hydration and provided urine strainer with instructions to bring stone(s) in if any are passed - Toradol given in clinic today; continue NSAIDs at home PRN and gave Rx for short course of Percocet - advised pt to f/u with PCP as needed if any further menstrual-type symptoms arise or if current symptoms persist  The above note reflects HPI obtained with Hampton AbbotAllie Goins, MS3, with additions / clarifications based on my own interview, and my independent assessment and plan.  Bobbye Mortonhristopher M Najmah Carradine, MD PGY-3, [facility] 09/20/2013, 7:41 PM

## 2013-09-20 NOTE — Patient Instructions (Addendum)
Thank you for coming in, today!  I think your pain could be from a kidney stone or an infection. Your pregnancy test was NEGATIVE. Your urine did not LOOK infected, but we will culture it.  You should take Keflex (cefalexin) for 7 days, 3 times a day. If your culture is negative, we will call you and you can stop the antibiotic early. We will give you a pain shot that won't make you sleepy, today. This medicine is like ibuprofen and is called ketorolac or Toradol.  Strain your urine. If you pass a stone, bring it back and we can look at it closer. You can take Percocet as needed for pain. It may make you very sleepy. Do not drive within about 6 hours of taking it.  Come back to see us as you need. I will send Dr. Pollie MeyerMcIntyre a note so she knows what we did. Please feel free to call with any questions or concerns at any time, at [phone]. --Dr. Casper HarrisonStreet

## 2013-09-21 ENCOUNTER — Telehealth: Payer: Self-pay | Admitting: Family Medicine

## 2013-09-21 MED ORDER — FLUCONAZOLE 150 MG PO TABS: ORAL_TABLET | ORAL | Status: DC

## 2013-09-21 NOTE — Telephone Encounter (Signed)
Patient informed. 

## 2013-09-21 NOTE — Telephone Encounter (Signed)
Rx sent in -- Diflucan 150 mg to take one pill one time at first sign of yeast infection, with instructions to take a second pill in one week if symptoms not resolved. #5 given for pt to have medication on hand in the future, if needed. Mistakenly not sent in at end of visit yesterday. Please let pt know. Thanks! --CMS

## 2013-09-21 NOTE — Telephone Encounter (Signed)
Was seen yesterday but did not get rx for yeast infection.  Please send to pharmacy.

## 2013-09-22 LAB — URINE CULTURE

## 2013-09-23 ENCOUNTER — Telehealth: Payer: Self-pay | Admitting: Family Medicine

## 2013-09-23 NOTE — Telephone Encounter (Signed)
Please call pt to let her know that her urine culture did grow E.coli and that her antibiotic should treat it fine. She should follow up with Dr Pollie MeyerMcIntyre as needed -- sooner rather than later if her symptoms are not better, or if they come back. Thanks! --CMS  Note forwarded FYI to Dr Pollie MeyerMcIntyre.

## 2013-09-26 ENCOUNTER — Telehealth: Payer: Self-pay | Admitting: *Deleted

## 2013-09-26 NOTE — Telephone Encounter (Signed)
Pt called needing to speak with PCP ASAP.  Pt experiencing a "nervous breakdown".  Pt denies any harm to self or others.  Will forward to PCP.  Clovis PuMartin, Nabilah Davoli L, RN

## 2013-09-26 NOTE — Telephone Encounter (Signed)
Called pt back, no answer. No voicemail either. If pt returns call, feel free to page me at [phone].  Latrelle DodrillBrittany J Sehaj Kolden, MD

## 2013-10-05 ENCOUNTER — Encounter: Payer: Self-pay | Admitting: Family Medicine

## 2013-10-05 NOTE — Progress Notes (Signed)
Patient ID: Ashley Jordan, female   DOB: 1970/09/30, 43 y.o.   MRN: [id number]  Patient was present today at her daughter's appointment (daughter had visit scheduled for acute complaint). Today was my first time meeting either pt or her daughter. Pt requested that I refill her percocet, which was prescribed by Dr. Casper HarrisonStreet several weeks ago for back pain at an acute visit. I advised that we do not refill controlled substances without an appointment, and that she would need to schedule an appointment to be seen, especially given that it was for an acute complaint that is persisting. Pt became angry and began loudly stating that she wanted a new doctor, that she no longer wants me to be either her physician or her daughter's physician. Pt was given PCP change request form by clinic staff prior to leaving.  Ashley DodrillBrittany J Teairra Millar, MD

## 2013-10-14 ENCOUNTER — Other Ambulatory Visit: Payer: Self-pay | Admitting: *Deleted

## 2013-10-14 MED ORDER — ALBUTEROL SULFATE HFA 108 (90 BASE) MCG/ACT IN AERS: 2.0000 | INHALATION_SPRAY | Freq: Four times a day (QID) | RESPIRATORY_TRACT | Status: DC | PRN

## 2013-10-27 ENCOUNTER — Telehealth: Payer: Self-pay | Admitting: *Deleted

## 2013-10-27 NOTE — Telephone Encounter (Signed)
Note from 10/05/13 documentation encounter: Patient was present today at her daughter's appointment (daughter had visit scheduled for acute complaint). Today was my first time meeting either pt or her daughter. Pt requested that I refill her percocet, which was prescribed by Dr. Casper HarrisonStreet several weeks ago for back pain at an acute visit. I advised that we do not refill controlled substances without an appointment, and that she would need to schedule an appointment to be seen, especially given that it was for an acute complaint that is persisting. Pt became angry and began loudly stating that she wanted a new doctor, that she no longer wants me to be either her physician or her daughter's physician. Pt was given PCP change request form by clinic staff prior to leaving.  Latrelle DodrillBrittany J McIntyre, MD   Discussed with Dr. Neal--Ok to grant PCP change request x 1. Called and informed patient. Requesting female PCP for herself and daughter Clement Sayres(Zoe Supan). Changed PCP to Dr. Richarda BladeAdamo.  Altamese Dilling~Jeannette Richardson, BSN, RN-BC

## 2013-10-27 NOTE — Progress Notes (Signed)
Discussed with Dr. Neal--Ok to grant PCP change request x 1.  Called and informed patient.  Requesting female PCP for herself and daughter [name].  Changed PCP to Dr. Richarda BladeAdamo. Altamese Dilling~Jeannette Richardson, BSN, RN-BC

## 2014-01-09 ENCOUNTER — Encounter: Payer: Self-pay | Admitting: Family Medicine

## 2014-01-12 ENCOUNTER — Ambulatory Visit: Payer: Medicaid Other | Admitting: Family Medicine

## 2014-02-06 ENCOUNTER — Encounter: Payer: Self-pay | Admitting: Family Medicine

## 2014-02-06 ENCOUNTER — Ambulatory Visit (INDEPENDENT_AMBULATORY_CARE_PROVIDER_SITE_OTHER): Payer: Medicaid Other | Admitting: Family Medicine

## 2014-02-06 VITALS — BP 134/73 | HR 103 | Temp 98.5°F | Ht 64.0 in | Wt 165.6 lb

## 2014-02-06 DIAGNOSIS — S76912A Strain of unspecified muscles, fascia and tendons at thigh level, left thigh, initial encounter: Secondary | ICD-10-CM

## 2014-02-06 MED ORDER — MELOXICAM 15 MG PO TABS: 15.0000 mg | ORAL_TABLET | Freq: Every day | ORAL | Status: DC

## 2014-02-06 NOTE — Progress Notes (Signed)
  Subjective:  Ashley Jordan is a 43 y.o. female presenting to same day clinic for groin pain. Slid and fell last night on driveway splitting legs and falling on a rock with her groin. Is sore on left upper thigh with initial bruising which has improved but still has some pain on the inside of the left leg. 10/10. Didn't take anything for pain today but tried aleve with a little bit of improvement. No systemic illness or fever. No incontinence or history of back pain.  - Review of Systems: Per HPI.  - Smoking status noted Objective:  BP 134/73 mmHg  Pulse 103  Temp(Src) 98.5 F (36.9 C) (Oral)  Ht 5\' 4"  (1.626 m)  Wt 165 lb 9.6 oz (75.116 kg)  BMI 28.41 kg/m2  LMP 01/19/2014 (Exact Date) Gen:  43 y.o. female in no distress Back:  Normal skin. Spine with normal alignment and no deformity. No tenderness to vertebral process palpation. Paraspinous muscles are not tender and without spasm. Range of motion is full at neck and lumbar sacral regions. Straight leg raise is negative Neuro:  Sensation and motor function 5/5 bilateral lower extremities. Patellar and achilles DTR's 2+ Assessment/Plan:  Ashley Jordan is a 43 y.o. female here for groin muscle strain.

## 2014-02-06 NOTE — Patient Instructions (Signed)
February 06, 2014   Patient: Ashley Jordan  Date of Birth: September 16, 1970  Date of Visit: 02/06/2014    To Whom it May Concern:  [name] was seen in my clinic on 02/06/2014. She may return to school on 12.1.2015.  If you have any questions or concerns, please don't hesitate to call.  Sincerely,     Hazeline JunkerGrunz, Sally Reimers, MD

## 2014-02-07 ENCOUNTER — Telehealth: Payer: Self-pay | Admitting: Family Medicine

## 2014-02-07 NOTE — Telephone Encounter (Signed)
Pt called and needs a letter to excuse her from class today 12[place]1. [place] NewcomerGrunz and given a not for yesterday but she can not even walk today and will miss class again. Please call when ready to pick up. jw

## 2014-02-08 ENCOUNTER — Encounter: Payer: Self-pay | Admitting: Family Medicine

## 2014-02-08 NOTE — Telephone Encounter (Signed)
LVM for patient to call back. ?

## 2014-02-08 NOTE — Telephone Encounter (Signed)
LVM for patient to call back to inform her that letter is up front for pick up

## 2014-02-21 NOTE — Assessment & Plan Note (Signed)
Symptomatic treatment with heat, NSAID, rest. Suspect to improve in next week at most. Will return for evaluation if not improving despite these measures.

## 2014-05-07 ENCOUNTER — Emergency Department (HOSPITAL_COMMUNITY)
Admission: EM | Admit: 2014-05-07 | Discharge: 2014-05-07 | Disposition: A | Discharge status: Home / Self Care | Payer: Medicaid Other | Attending: Emergency Medicine | Admitting: Emergency Medicine

## 2014-05-07 ENCOUNTER — Encounter (HOSPITAL_COMMUNITY): Payer: Self-pay | Admitting: Emergency Medicine

## 2014-05-07 DIAGNOSIS — Z7952 Long term (current) use of systemic steroids: Secondary | ICD-10-CM | POA: Diagnosis not present

## 2014-05-07 DIAGNOSIS — R1110 Vomiting, unspecified: Secondary | ICD-10-CM | POA: Diagnosis not present

## 2014-05-07 DIAGNOSIS — K088 Other specified disorders of teeth and supporting structures: Secondary | ICD-10-CM | POA: Diagnosis not present

## 2014-05-07 DIAGNOSIS — L0201 Cutaneous abscess of face: Secondary | ICD-10-CM | POA: Diagnosis not present

## 2014-05-07 DIAGNOSIS — Z8619 Personal history of other infectious and parasitic diseases: Secondary | ICD-10-CM | POA: Insufficient documentation

## 2014-05-07 DIAGNOSIS — Z720 Tobacco use: Secondary | ICD-10-CM | POA: Diagnosis not present

## 2014-05-07 DIAGNOSIS — G8918 Other acute postprocedural pain: Secondary | ICD-10-CM | POA: Insufficient documentation

## 2014-05-07 DIAGNOSIS — K0889 Other specified disorders of teeth and supporting structures: Secondary | ICD-10-CM

## 2014-05-07 DIAGNOSIS — R000 Tachycardia, unspecified: Secondary | ICD-10-CM | POA: Insufficient documentation

## 2014-05-07 DIAGNOSIS — Z791 Long term (current) use of non-steroidal anti-inflammatories (NSAID): Secondary | ICD-10-CM | POA: Diagnosis not present

## 2014-05-07 DIAGNOSIS — Z8632 Personal history of gestational diabetes: Secondary | ICD-10-CM | POA: Diagnosis not present

## 2014-05-07 DIAGNOSIS — Z79899 Other long term (current) drug therapy: Secondary | ICD-10-CM | POA: Diagnosis not present

## 2014-05-07 DIAGNOSIS — Z792 Long term (current) use of antibiotics: Secondary | ICD-10-CM | POA: Diagnosis not present

## 2014-05-07 DIAGNOSIS — J45909 Unspecified asthma, uncomplicated: Secondary | ICD-10-CM | POA: Insufficient documentation

## 2014-05-07 DIAGNOSIS — M542 Cervicalgia: Secondary | ICD-10-CM | POA: Insufficient documentation

## 2014-05-07 MED ORDER — OXYCODONE-ACETAMINOPHEN 5-325 MG PO TABS
1.0000 | ORAL_TABLET | Freq: Once | ORAL | Status: AC
Start: 2014-05-07 — End: 2014-05-07
  Administered 2014-05-07: 1 via ORAL
  Filled 2014-05-07: qty 1

## 2014-05-07 MED ORDER — OXYCODONE-ACETAMINOPHEN 5-325 MG PO TABS: 1.0000 | ORAL_TABLET | Freq: Four times a day (QID) | ORAL | Status: DC | PRN

## 2014-05-07 MED ORDER — CEPHALEXIN 500 MG PO CAPS: 500.0000 mg | ORAL_CAPSULE | Freq: Four times a day (QID) | ORAL | Status: DC

## 2014-05-07 MED ORDER — CEPHALEXIN 250 MG PO CAPS
250.0000 mg | ORAL_CAPSULE | Freq: Once | ORAL | Status: AC
  Administered 2014-05-07: 250 mg via ORAL
  Filled 2014-05-07: qty 1

## 2014-05-07 NOTE — ED Notes (Signed)
Wound care completed to L jaw line, pt provided extra bandages and gloves.

## 2014-05-07 NOTE — ED Provider Notes (Signed)
CSN: [id number]     Arrival date & time 05/07/14  2115 History  This chart was scribed for non-physician practitioner, Melvenia BeamShari A. Hector ShadeUpstill, PA-C working with Linwood DibblesJon Knapp, MD by Gwenyth Oberatherine Macek, ED scribe. This patient was seen in room WTR5/WTR5 and the patient's care was started at 10:15 PM   Chief Complaint  Patient presents with  . Abscess   The history is provided by the patient. No language interpreter was used.    HPI Comments: Ashley Jordan is a 44 y.o. female who presents to the Emergency Department complaining of constant, tight left-sided jaw pain that radiates to her left neck and shoulder and started 5 days ago. Pt reports a "pimple" on the left mandibular area that she popped, with subsequent gradually worsening swelling, pain and green purulent drainage. She notes a fever of 101 that occurred 4 days ago, intermittent muscle spasms in her jaw, swelling and 2 episodes of vomiting as associated symptoms. Her pain becomes worse with opening her mouth. Additionally, the pt had 4 teeth, including her top 3rd molars bilaterally and her bottom left 3rd molar, removed under anesthesia 5 days ago. She notes she was prescribed Norco following the procedure which does not provided adequate pain relief, and she denies antibiotic treatment.   [facility]; Transport plannerral Surgeon at [facility] Past Medical History  Diagnosis Date  . Asthma   . Genital herpes   . Gestational diabetes    Past Surgical History  Procedure Laterality Date  . Induced abortion     Family History  Problem Relation Age of Onset  . Hypertension Mother   . Diabetes Father    History  Substance Use Topics  . Smoking status: Current Some Day Smoker -- 0.30 packs/day    Types: Cigarettes  . Smokeless tobacco: Current User    Types: Chew     Comment: occasional smoker  . Alcohol Use: 0.0 oz/week    0 Standard drinks or equivalent per week     Comment: social   OB History    Gravida Para Term Preterm AB TAB SAB  Ectopic Multiple Living   3 1 0 1 1  1   1      Review of Systems  Constitutional: Positive for fever.  HENT: Positive for dental problem and facial swelling.   Gastrointestinal: Positive for vomiting.  Musculoskeletal: Positive for arthralgias and neck pain.  Skin: Positive for wound.  Neurological: Negative for numbness.   Allergies  Septra and Tramadol  Home Medications   Prior to Admission medications   Medication Sig Start Date End Date Taking? Authorizing Provider  albuterol (PROVENTIL HFA;VENTOLIN HFA) 108 (90 BASE) MCG/ACT inhaler Inhale 2 puffs into the lungs every 6 (six) hours as needed for wheezing. 10/14/13  Yes Tommie SamsJayce G Cook, DO  cetirizine (ZYRTEC) 10 MG tablet Take 1 tablet (10 mg total) by mouth daily. Patient taking differently: Take 10 mg by mouth daily as needed for allergies.  12/31/11  Yes Charm RingsErin J Honig, MD  fluticasone (FLONASE) 50 MCG/ACT nasal spray Place 2 sprays into both nostrils daily as needed for allergies.  07/10/10  Yes Cat Ta, MD  Fluticasone-Salmeterol (ADVAIR) 500-50 MCG/DOSE AEPB Inhale 1 puff into the lungs every 12 (twelve) hours. 07/22/12  Yes Charm RingsErin J Honig, MD  HYDROcodone-acetaminophen (NORCO/VICODIN) 5-325 MG per tablet Take 1 tablet by mouth every 6 (six) hours as needed for moderate pain.   Yes Historical Provider, MD  hydrOXYzine (VISTARIL) 50 MG capsule Take 1 capsule (  50 mg total) by mouth daily as needed for anxiety or itching. 08/03/13  Yes Charm Rings, MD  ibuprofen (ADVIL,MOTRIN) 200 MG tablet Take 600 mg by mouth every 6 (six) hours as needed for moderate pain.    Yes Historical Provider, MD  valACYclovir (VALTREX) 1000 MG tablet Take 1 tablet (1,000 mg total) by mouth 2 (two) times daily. For 3 days at start of outbreak 03/23/13  Yes Charm Rings, MD  cephALEXin (KEFLEX) 500 MG capsule Take 1 capsule (500 mg total) by mouth 3 (three) times daily. Patient not taking: Reported on 05/07/2014 09/20/13   Stephanie Coup Street, MD  fluconazole  (DIFLUCAN) 150 MG tablet Take one pill (150 mg) one time at the first symptoms of yeast infection. Repeat in 1 week if needed. Patient not taking: Reported on 05/07/2014 09/21/13   Stephanie Coup Street, MD  meloxicam (MOBIC) 15 MG tablet Take 1 tablet (15 mg total) by mouth daily. Patient not taking: Reported on 05/07/2014 02/06/14   Tyrone Nine, MD  oxyCODONE-acetaminophen (ROXICET) 5-325 MG per tablet Take 1 tablet by mouth every 8 (eight) hours as needed for severe pain. Patient not taking: Reported on 05/07/2014 09/20/13   Stephanie Coup Street, MD  predniSONE (DELTASONE) 50 MG tablet Take 1 tablet daily for 5 days. Patient not taking: Reported on 05/07/2014 07/14/13   Charm Rings, MD   BP 132/99 mmHg  Pulse 122  Temp(Src) 99.5 F (37.5 C) (Oral)  Resp 20  Ht  (1.6 m)  Wt 157 lb (71.215 kg)  BMI 27.82 kg/m2  SpO2 99%  LMP 04/24/2014 Physical Exam  Constitutional: She is oriented to person, place, and time. She appears well-developed and well-nourished. No distress.  HENT:  Head: Normocephalic and atraumatic.  Mouth/Throat: Oropharynx is clear and moist.  Redness of left TM; nose TTP; Multiple dental extractions without significant intraoral swelling or visualized dental abscess.  Eyes: Conjunctivae and EOM are normal.  Neck: Normal range of motion. Neck supple. No tracheal deviation present.  No cervical lymphadenopathy  Pulmonary/Chest: Effort normal. No respiratory distress.  Musculoskeletal:  Tenderness to left side of neck, trapezius and SCM  Lymphadenopathy:    She has no cervical adenopathy.  Neurological: She is alert and oriented to person, place, and time.  Skin: Skin is warm and dry.  Swelling to left aspect of chin, 1 cm round with clear purulent drainage; 1 cm circumference around area is firm-to-touch; findings consistent with cutaneous abscess.   Psychiatric: She has a normal mood and affect. Her behavior is normal.  Nursing note and vitals reviewed.   ED  Course  Procedures  DIAGNOSTIC STUDIES: Oxygen Saturation is 99% on RA, normal by my interpretation.    COORDINATION OF CARE: 10:32 PM Discussed treatment plan with pt at bedside and pt agreed to plan.   Labs Review Labs Reviewed - No data to display  Imaging Review No results found.   EKG Interpretation None      MDM   Final diagnoses:  None    1. Cutaneous facial abscess 2. Dental pain (post operative)  The extraction sites appear unremarkable without evidence of infection. The history of pustule opened by patient with subsequent swelling and continued drainage are c/w cutaneous abscess. The patient has tachycardia, low grade temperature, but is completely non-toxic in appearance. She is having no difficulty with swallowing. Feel tachycardia is related to pain.  I personally performed the services described in this documentation, which was scribed in my presence.  The recorded information has been reviewed and is accurate.     Arnoldo Hooker, PA-C 05/07/14 2319  Linwood Dibbles, MD 05/07/14 [phone]

## 2014-05-07 NOTE — ED Notes (Signed)
Pt states she had dental surgery approx. 1 week ago and now has an area on the outside of her face that she thought was a 'pimple' and she popped it. Area has now grown. Alert and oriented.

## 2014-05-07 NOTE — Discharge Instructions (Signed)
Abscess °An abscess is an infected area that contains a collection of pus and debris. It can occur in almost any part of the body. An abscess is also known as a furuncle or boil. °CAUSES  °An abscess occurs when tissue gets infected. This can occur from blockage of oil or sweat glands, infection of hair follicles, or a minor injury to the skin. As the body tries to fight the infection, pus collects in the area and creates pressure under the skin. This pressure causes pain. People with weakened immune systems have difficulty fighting infections and get certain abscesses more often.  °SYMPTOMS °Usually an abscess develops on the skin and becomes a painful mass that is red, warm, and tender. If the abscess forms under the skin, you may feel a moveable soft area under the skin. Some abscesses break open (rupture) on their own, but most will continue to get worse without care. The infection can spread deeper into the body and eventually into the bloodstream, causing you to feel ill.  °DIAGNOSIS  °Your caregiver will take your medical history and perform a physical exam. A sample of fluid may also be taken from the abscess to determine what is causing your infection. °TREATMENT  °Your caregiver may prescribe antibiotic medicines to fight the infection. However, taking antibiotics alone usually does not cure an abscess. Your caregiver may need to make a small cut (incision) in the abscess to drain the pus. In some cases, gauze is packed into the abscess to reduce pain and to continue draining the area. °HOME CARE INSTRUCTIONS  °· Only take over-the-counter or prescription medicines for pain, discomfort, or fever as directed by your caregiver. °· If you were prescribed antibiotics, take them as directed. Finish them even if you start to feel better. °· If gauze is used, follow your caregiver's directions for changing the gauze. °· To avoid spreading the infection: °· Keep your draining abscess covered with a  bandage. °· Wash your hands well. °· Do not share personal care items, towels, or whirlpools with others. °· Avoid skin contact with others. °· Keep your skin and clothes clean around the abscess. °· Keep all follow-up appointments as directed by your caregiver. °SEEK MEDICAL CARE IF:  °· You have increased pain, swelling, redness, fluid drainage, or bleeding. °· You have muscle aches, chills, or a general ill feeling. °· You have a fever. °MAKE SURE YOU:  °· Understand these instructions. °· Will watch your condition. °· Will get help right away if you are not doing well or get worse. °Document Released: 12/04/2004 Document Revised: 08/26/2011 Document Reviewed: 05/09/2011 °Exit[employer]® Patient Information ©2015 [employer]. This information is not intended to replace advice given to you by your health care provider. Make sure you discuss any questions you have with your health care provider. °Heat Therapy °Heat therapy can help ease sore, stiff, injured, and tight muscles and joints. Heat relaxes your muscles, which may help ease your pain.  °RISKS AND COMPLICATIONS °If you have any of the following conditions, do not use heat therapy unless your health care provider has approved: °· Poor circulation. °· Healing wounds or scarred skin in the area being treated. °· Diabetes, heart disease, or high blood pressure. °· Not being able to feel (numbness) the area being treated. °· Unusual swelling of the area being treated. °· Active infections. °· Blood clots. °· Cancer. °· Inability to communicate pain. This may include young children and people who have problems with their brain function (dementia). °· Pregnancy. °  Heat therapy should only be used on old, pre-existing, or long-lasting (chronic) injuries. Do not use heat therapy on new injuries unless directed by your health care provider. °HOW TO USE HEAT THERAPY °There are several different kinds of heat therapy, including: °· Moist heat pack. °· Warm water  bath. °· Hot water bottle. °· Electric heating pad. °· Heated gel pack. °· Heated wrap. °· Electric heating pad. °Use the heat therapy method suggested by your health care provider. Follow your health care provider's instructions on when and how to use heat therapy. °GENERAL HEAT THERAPY RECOMMENDATIONS °· Do not sleep while using heat therapy. Only use heat therapy while you are awake. °· Your skin may turn pink while using heat therapy. Do not use heat therapy if your skin turns red. °· Do not use heat therapy if you have new pain. °· High heat or long exposure to heat can cause burns. Be careful when using heat therapy to avoid burning your skin. °· Do not use heat therapy on areas of your skin that are already irritated, such as with a rash or sunburn. °SEEK MEDICAL CARE IF: °· You have blisters, redness, swelling, or numbness. °· You have new pain. °· Your pain is worse. °MAKE SURE YOU: °· Understand these instructions. °· Will watch your condition. °· Will get help right away if you are not doing well or get worse. °Document Released: 05/19/2011 Document Revised: 07/11/2013 Document Reviewed: 04/19/2013 °ExitCare® Patient Information ©2015 [employer]. This information is not intended to replace advice given to you by your health care provider. Make sure you discuss any questions you have with your health care provider. ° °

## 2014-05-10 ENCOUNTER — Encounter (HOSPITAL_COMMUNITY): Payer: Self-pay | Admitting: Emergency Medicine

## 2014-05-10 ENCOUNTER — Emergency Department (HOSPITAL_COMMUNITY)
Admission: EM | Admit: 2014-05-10 | Discharge: 2014-05-10 | Disposition: A | Discharge status: Home / Self Care | Payer: Medicaid Other | Attending: Emergency Medicine | Admitting: Emergency Medicine

## 2014-05-10 DIAGNOSIS — Z7952 Long term (current) use of systemic steroids: Secondary | ICD-10-CM | POA: Diagnosis not present

## 2014-05-10 DIAGNOSIS — L089 Local infection of the skin and subcutaneous tissue, unspecified: Secondary | ICD-10-CM

## 2014-05-10 DIAGNOSIS — Z791 Long term (current) use of non-steroidal anti-inflammatories (NSAID): Secondary | ICD-10-CM | POA: Diagnosis not present

## 2014-05-10 DIAGNOSIS — Z720 Tobacco use: Secondary | ICD-10-CM | POA: Insufficient documentation

## 2014-05-10 DIAGNOSIS — Z792 Long term (current) use of antibiotics: Secondary | ICD-10-CM | POA: Insufficient documentation

## 2014-05-10 DIAGNOSIS — Z7951 Long term (current) use of inhaled steroids: Secondary | ICD-10-CM | POA: Insufficient documentation

## 2014-05-10 DIAGNOSIS — J45909 Unspecified asthma, uncomplicated: Secondary | ICD-10-CM | POA: Diagnosis not present

## 2014-05-10 DIAGNOSIS — Z79899 Other long term (current) drug therapy: Secondary | ICD-10-CM | POA: Diagnosis not present

## 2014-05-10 DIAGNOSIS — Z8619 Personal history of other infectious and parasitic diseases: Secondary | ICD-10-CM | POA: Diagnosis not present

## 2014-05-10 DIAGNOSIS — Z8632 Personal history of gestational diabetes: Secondary | ICD-10-CM | POA: Diagnosis not present

## 2014-05-10 MED ORDER — OXYCODONE-ACETAMINOPHEN 5-325 MG PO TABS: 1.0000 | ORAL_TABLET | Freq: Four times a day (QID) | ORAL | Status: DC | PRN

## 2014-05-10 MED ORDER — HYDROCODONE-ACETAMINOPHEN 5-325 MG PO TABS: 1.0000 | ORAL_TABLET | Freq: Four times a day (QID) | ORAL | Status: DC | PRN

## 2014-05-10 MED ORDER — OXYCODONE-ACETAMINOPHEN 5-325 MG PO TABS
1.0000 | ORAL_TABLET | Freq: Once | ORAL | Status: AC
  Administered 2014-05-10: 1 via ORAL
  Filled 2014-05-10: qty 1

## 2014-05-10 MED ORDER — CLINDAMYCIN HCL 150 MG PO CAPS: 450.0000 mg | ORAL_CAPSULE | Freq: Three times a day (TID) | ORAL | Status: DC

## 2014-05-10 NOTE — Discharge Instructions (Signed)
Return here as needed. Use warm compresses around the area.

## 2014-05-10 NOTE — ED Notes (Signed)
Pt coming back in for recheck of wound on chin. States still draining and painful.

## 2014-05-10 NOTE — ED Provider Notes (Signed)
CSN: [id number]     Arrival date & time 05/10/14  1101 History   First MD Initiated Contact with Patient 05/10/14 1122     Chief Complaint  Patient presents with  . Recurrent Skin Infections     (Consider location/radiation/quality/duration/timing/severity/associated sxs/prior Treatment) HPI Patient presents to the emergency department with a recheck on an abscess to her left jawline.  The patient states that she was seen several days ago for the infection along the jawline.  She is placed on Keflex and felt like there was still pain, so she came back for recheck.  She states the area drained a fair amount of fluid and there is still an open spot on the face.  Patient, states she has not had any fever, nausea, vomiting, weakness, dizziness, headache, blurred vision, back pain, neck pain, cough, runny nose, sore throat, toothache or syncope Past Medical History  Diagnosis Date  . Asthma   . Genital herpes   . Gestational diabetes    Past Surgical History  Procedure Laterality Date  . Induced abortion     Family History  Problem Relation Age of Onset  . Hypertension Mother   . Diabetes Father    History  Substance Use Topics  . Smoking status: Current Some Day Smoker -- 0.30 packs/day    Types: Cigarettes  . Smokeless tobacco: Current User    Types: Chew     Comment: occasional smoker  . Alcohol Use: 0.0 oz/week    0 Standard drinks or equivalent per week     Comment: social   OB History    Gravida Para Term Preterm AB TAB SAB Ectopic Multiple Living   3 1 0 1 1  1   1      Review of Systems  All other systems negative except as documented in the HPI. All pertinent positives and negatives as reviewed in the HPI.  Allergies  Septra and Tramadol  Home Medications   Prior to Admission medications   Medication Sig Start Date End Date Taking? Authorizing Provider  albuterol (PROVENTIL HFA;VENTOLIN HFA) 108 (90 BASE) MCG/ACT inhaler Inhale 2 puffs into the lungs every 6  (six) hours as needed for wheezing. 10/14/13  Yes Tommie SamsJayce G Cook, DO  cephALEXin (KEFLEX) 500 MG capsule Take 1 capsule (500 mg total) by mouth 4 (four) times daily. 05/07/14  Yes Shari A Upstill, PA-C  cetirizine (ZYRTEC) 10 MG tablet Take 1 tablet (10 mg total) by mouth daily. Patient taking differently: Take 10 mg by mouth daily as needed for allergies.  12/31/11  Yes Charm RingsErin J Honig, MD  fluticasone (FLONASE) 50 MCG/ACT nasal spray Place 2 sprays into both nostrils daily.  07/10/10  Yes Cat Ta, MD  hydrOXYzine (VISTARIL) 50 MG capsule Take 1 capsule (50 mg total) by mouth daily as needed for anxiety or itching. Patient taking differently: Take 50 mg by mouth at bedtime.  08/03/13  Yes Charm RingsErin J Honig, MD  ibuprofen (ADVIL,MOTRIN) 200 MG tablet Take 200 mg by mouth every 6 (six) hours as needed for moderate pain (pain).   Yes Historical Provider, MD  ibuprofen (ADVIL,MOTRIN) 600 MG tablet Take 600 mg by mouth every 6 (six) hours as needed for moderate pain (pain).   Yes Historical Provider, MD  meloxicam (MOBIC) 15 MG tablet Take 1 tablet (15 mg total) by mouth daily. Patient taking differently: Take 15 mg by mouth daily as needed for pain (pain).  02/06/14  Yes Tyrone Nineyan B Grunz, MD  oxyCODONE-acetaminophen (PERCOCET/ROXICET) 5-325 MG per  tablet Take 1-2 tablets by mouth every 6 (six) hours as needed for severe pain. 05/07/14  Yes Shari A Upstill, PA-C  fluconazole (DIFLUCAN) 150 MG tablet Take one pill (150 mg) one time at the first symptoms of yeast infection. Repeat in 1 week if needed. Patient not taking: Reported on 05/07/2014 09/21/13   Stephanie Coup Street, MD  Fluticasone-Salmeterol (ADVAIR) 500-50 MCG/DOSE AEPB Inhale 1 puff into the lungs every 12 (twelve) hours. Patient not taking: Reported on 05/10/2014 07/22/12   Charm Rings, MD  predniSONE (DELTASONE) 50 MG tablet Take 1 tablet daily for 5 days. Patient not taking: Reported on 05/07/2014 07/14/13   Charm Rings, MD  valACYclovir (VALTREX) 1000 MG tablet  Take 1 tablet (1,000 mg total) by mouth 2 (two) times daily. For 3 days at start of outbreak Patient not taking: Reported on 05/10/2014 03/23/13   Charm Rings, MD   BP 135/92 mmHg  Pulse 105  Temp(Src) 98.4 F (36.9 C) (Oral)  Resp 20  SpO2 95%  LMP 04/24/2014 Physical Exam  Constitutional: She is oriented to person, place, and time. She appears well-developed and well-nourished. No distress.  HENT:  Head:    Cardiovascular: Normal rate, regular rhythm and normal heart sounds.   Pulmonary/Chest: Effort normal and breath sounds normal.  Neurological: She is alert and oriented to person, place, and time.    ED Course  Procedures (including critical care time) Patient is advised follow-up with her primary care Dr. told to return here for recheck.  Patient is given clindamycin rather than Keflex for this.  The patient is also advised to use warm compresses on the area  MDM   Final diagnoses:  None        Carlyle Dolly, PA-C 05/12/14 1557  Toy Baker, MD 05/14/14 [phone]

## 2014-06-19 ENCOUNTER — Encounter: Payer: Self-pay | Admitting: *Deleted

## 2014-06-19 ENCOUNTER — Ambulatory Visit (INDEPENDENT_AMBULATORY_CARE_PROVIDER_SITE_OTHER): Payer: Medicaid Other | Admitting: *Deleted

## 2014-06-19 DIAGNOSIS — Z111 Encounter for screening for respiratory tuberculosis: Secondary | ICD-10-CM

## 2014-06-19 NOTE — Progress Notes (Signed)
   PPD placed Left Forearm.  Pt to return 06/21/2014 for reading.  Pt tolerated intradermal injection. Clovis PuMartin, Tamika L, RN

## 2014-06-21 ENCOUNTER — Ambulatory Visit: Payer: Medicaid Other | Admitting: Family Medicine

## 2014-06-23 ENCOUNTER — Ambulatory Visit: Payer: Medicaid Other | Admitting: Family Medicine

## 2014-07-31 ENCOUNTER — Emergency Department (HOSPITAL_COMMUNITY)
Admission: EM | Admit: 2014-07-31 | Discharge: 2014-07-31 | Disposition: A | Discharge status: Home / Self Care | Payer: Medicaid Other | Attending: Emergency Medicine | Admitting: Emergency Medicine

## 2014-07-31 ENCOUNTER — Encounter (HOSPITAL_COMMUNITY): Payer: Self-pay

## 2014-07-31 DIAGNOSIS — N63 Unspecified lump in unspecified breast: Secondary | ICD-10-CM

## 2014-07-31 DIAGNOSIS — J45909 Unspecified asthma, uncomplicated: Secondary | ICD-10-CM | POA: Diagnosis not present

## 2014-07-31 DIAGNOSIS — Z79899 Other long term (current) drug therapy: Secondary | ICD-10-CM | POA: Insufficient documentation

## 2014-07-31 DIAGNOSIS — Z8632 Personal history of gestational diabetes: Secondary | ICD-10-CM | POA: Diagnosis not present

## 2014-07-31 DIAGNOSIS — Z792 Long term (current) use of antibiotics: Secondary | ICD-10-CM | POA: Insufficient documentation

## 2014-07-31 DIAGNOSIS — Z7951 Long term (current) use of inhaled steroids: Secondary | ICD-10-CM | POA: Diagnosis not present

## 2014-07-31 DIAGNOSIS — Z7952 Long term (current) use of systemic steroids: Secondary | ICD-10-CM | POA: Insufficient documentation

## 2014-07-31 DIAGNOSIS — Z791 Long term (current) use of non-steroidal anti-inflammatories (NSAID): Secondary | ICD-10-CM | POA: Insufficient documentation

## 2014-07-31 DIAGNOSIS — L089 Local infection of the skin and subcutaneous tissue, unspecified: Secondary | ICD-10-CM | POA: Diagnosis present

## 2014-07-31 DIAGNOSIS — R2231 Localized swelling, mass and lump, right upper limb: Secondary | ICD-10-CM | POA: Diagnosis not present

## 2014-07-31 DIAGNOSIS — Z720 Tobacco use: Secondary | ICD-10-CM | POA: Diagnosis not present

## 2014-07-31 DIAGNOSIS — Z8619 Personal history of other infectious and parasitic diseases: Secondary | ICD-10-CM | POA: Insufficient documentation

## 2014-07-31 MED ORDER — OXYCODONE-ACETAMINOPHEN 5-325 MG PO TABS
1.0000 | ORAL_TABLET | Freq: Once | ORAL | Status: AC
  Administered 2014-07-31: 1 via ORAL
  Filled 2014-07-31: qty 1

## 2014-07-31 MED ORDER — OXYCODONE-ACETAMINOPHEN 5-325 MG PO TABS: 2.0000 | ORAL_TABLET | ORAL | Status: DC | PRN

## 2014-07-31 MED ORDER — HYDROCODONE-ACETAMINOPHEN 5-325 MG PO TABS: 2.0000 | ORAL_TABLET | ORAL | Status: DC | PRN

## 2014-07-31 NOTE — ED Notes (Signed)
Discussed pain medication with patient, will administer first dose. Patient will have her Boyfriend transport her home.

## 2014-07-31 NOTE — ED Notes (Signed)
Patient stated that "norco" makes her itch.

## 2014-07-31 NOTE — ED Notes (Signed)
Pt complains of an abcess under her right arm, no drainage, but pt states that it's getting bigger and it's making her arm and breast sore, her right breast feels harder than her other

## 2014-07-31 NOTE — ED Provider Notes (Signed)
CSN: [id number]     Arrival date & time 07/31/14  2053 History   First MD Initiated Contact with Patient 07/31/14 2137     Chief Complaint  Patient presents with  . Recurrent Skin Infections     HPI  Patient presents for evaluation of a painful lump under right arm. States that she was seen by her physician "for 6 weeks ago". She was told to use warm compresses. She alleged that it may have been "related to shaving". Has become bigger. She states it is to the size now that when she puts her arm against her side it is painful.  As there had a mammogram. When I inquire about any self breast exam she states that she does perform them.   She demonstrates to me a mass of the 6:00 position in her right breast.  Past Medical History  Diagnosis Date  . Asthma   . Genital herpes   . Gestational diabetes    Past Surgical History  Procedure Laterality Date  . Induced abortion     Family History  Problem Relation Age of Onset  . Hypertension Mother   . Diabetes Father    History  Substance Use Topics  . Smoking status: Current Some Day Smoker -- 0.30 packs/day    Types: Cigarettes  . Smokeless tobacco: Current User    Types: Chew     Comment: occasional smoker  . Alcohol Use: 0.0 oz/week    0 Standard drinks or equivalent per week     Comment: social   OB History    Gravida Para Term Preterm AB TAB SAB Ectopic Multiple Living   3 1 0 Review of Systems  Constitutional: Negative for fever, chills, diaphoresis, appetite change and fatigue.  HENT: Negative for mouth sores, sore throat and trouble swallowing.   Eyes: Negative for visual disturbance.  Respiratory: Negative for cough, chest tightness, shortness of breath and wheezing.   Cardiovascular: Negative for chest pain.  Gastrointestinal: Negative for nausea, vomiting, abdominal pain, diarrhea and abdominal distention.  Endocrine: Negative for polydipsia, polyphagia and polyuria.  Genitourinary: Negative for  dysuria, frequency and hematuria.  Musculoskeletal: Negative for gait problem.  Skin: Negative for color change, pallor and rash.  Neurological: Negative for dizziness, syncope, light-headedness and headaches.  Hematological: Does not bruise/bleed easily.  Psychiatric/Behavioral: Negative for behavioral problems and confusion.      Allergies  Septra and Tramadol  Home Medications   Prior to Admission medications   Medication Sig Start Date End Date Taking? Authorizing Provider  albuterol (PROVENTIL HFA;VENTOLIN HFA) 108 (90 BASE) MCG/ACT inhaler Inhale 2 puffs into the lungs every 6 (six) hours as needed for wheezing. 10/14/13   Tommie Sams, DO  cephALEXin (KEFLEX) 500 MG capsule Take 1 capsule (500 mg total) by mouth 4 (four) times daily. 05/07/14   Elpidio Anis, PA-C  cetirizine (ZYRTEC) 10 MG tablet Take 1 tablet (10 mg total) by mouth daily. Patient taking differently: Take 10 mg by mouth daily as needed for allergies.  12/31/11   Charm Rings, MD  clindamycin (CLEOCIN) 150 MG capsule Take 3 capsules (450 mg total) by mouth 3 (three) times daily. 05/10/14   Charlestine Night, PA-C  fluconazole (DIFLUCAN) 150 MG tablet Take one pill (150 mg) one time at the first symptoms of yeast infection. Repeat in 1 week if needed. Patient not taking: Reported on 05/07/2014 09/21/13   Bobbye Morton, MD  fluticasone (FLONASE) 50 MCG/ACT nasal spray Place 2 sprays into both nostrils daily.  07/10/10   Cat Ta, MD  Fluticasone-Salmeterol (ADVAIR) 500-50 MCG/DOSE AEPB Inhale 1 puff into the lungs every 12 (twelve) hours. Patient not taking: Reported on 05/10/2014 07/22/12   Charm RingsErin J Honig, MD  HYDROcodone-acetaminophen (NORCO/VICODIN) 5-325 MG per tablet Take 2 tablets by mouth every 4 (four) hours as needed. 07/31/14   Rolland PorterMark Maron Stanzione, MD  hydrOXYzine (VISTARIL) 50 MG capsule Take 1 capsule (50 mg total) by mouth daily as needed for anxiety or itching. Patient taking differently: Take 50 mg by mouth at bedtime.   08/03/13   Charm RingsErin J Honig, MD  ibuprofen (ADVIL,MOTRIN) 200 MG tablet Take 200 mg by mouth every 6 (six) hours as needed for moderate pain (pain).    Historical Provider, MD  ibuprofen (ADVIL,MOTRIN) 600 MG tablet Take 600 mg by mouth every 6 (six) hours as needed for moderate pain (pain).    Historical Provider, MD  meloxicam (MOBIC) 15 MG tablet Take 1 tablet (15 mg total) by mouth daily. Patient taking differently: Take 15 mg by mouth daily as needed for pain (pain).  02/06/14   Tyrone Nineyan B Grunz, MD  oxyCODONE-acetaminophen (PERCOCET) 5-325 MG per tablet Take 1 tablet by mouth every 6 (six) hours as needed for severe pain. 05/10/14   Charlestine Nighthristopher Lawyer, PA-C  predniSONE (DELTASONE) 50 MG tablet Take 1 tablet daily for 5 days. Patient not taking: Reported on 05/07/2014 07/14/13   Charm RingsErin J Honig, MD  valACYclovir (VALTREX) 1000 MG tablet Take 1 tablet (1,000 mg total) by mouth 2 (two) times daily. For 3 days at start of outbreak Patient not taking: Reported on 05/10/2014 03/23/13   Charm RingsErin J Honig, MD   BP 130/78 mmHg  Pulse 98  Temp(Src) 97.9 F (36.6 C) (Oral)  Resp 20  Ht 5\' 4"  (1.626 m)  Wt 158 lb (71.668 kg)  BMI 27.11 kg/m2  SpO2 100%  LMP 07/31/2014 Physical Exam  Constitutional: She is oriented to person, place, and time. She appears well-developed and well-nourished. No distress.  HENT:  Head: Normocephalic.  Eyes: Conjunctivae are normal. Pupils are equal, round, and reactive to light. No scleral icterus.  Neck: Normal range of motion. Neck supple. No thyromegaly present.  Cardiovascular: Normal rate and regular rhythm.  Exam reveals no gallop and no friction rub.   No murmur heard. Pulmonary/Chest: Effort normal and breath sounds normal. No respiratory distress. She has no wheezes. She has no rales.    Abdominal: Soft. Bowel sounds are normal. She exhibits no distension. There is no tenderness. There is no rebound.  Musculoskeletal: Normal range of motion.  Neurological: She is alert and  oriented to person, place, and time.  Skin: Skin is warm and dry. No rash noted.  Psychiatric: She has a normal mood and affect. Her behavior is normal.    ED Course  Procedures (including critical care time) Labs Review Labs Reviewed - No data to display  Imaging Review No results found.   EKG Interpretation None      MDM   Final diagnoses:  Axillary mass, right  Breast mass   Limited bedside ultrasound the right axilla mass does not show fluid collection. I can grasp with the mass within my fingers and place ultrasound on it without demonstrating fluid collection.  I discussed at length with the patient the importance of being seen in follow for formal imaging including ultrasound and mammogram. I'm concerned that the mass in her breast and the  firm mass in her right axilla may represent rest cancer. Given her the information regarding [facility].   I've used the words "breast cancer in "to her in no uncertain terms. I strongly urged her to make her follow-up appointment tomorrow. I've given her the name of our social worker at Hospital she has a difficult making the appointment. I've given her the information and phone number for the resident clinic where she is followed. If she has any difficulty obtaining this appointment quickly, I have asked her to call for a follow-up appointment with her primary care physician.    Rolland Porter, MD 07/31/14 2229

## 2014-07-31 NOTE — Discharge Instructions (Signed)
The mass  (Lump) in your armpit and breast are concerning for a Breast Cancer.  [facility] for an appointment for a mammogram and ultrasound.  Recheck with your physician at the [facility] clinic.  Tell them that you have a mass and are going to the [facility] Center.  You will need a follow up appointment after the Breast Clinic appointment.

## 2014-08-01 ENCOUNTER — Telehealth (HOSPITAL_COMMUNITY): Payer: Self-pay

## 2014-08-01 NOTE — Telephone Encounter (Signed)
Attempting to get pt an appointment with breast center. Order written by Dr Silverio LayYao.

## 2014-08-02 ENCOUNTER — Other Ambulatory Visit: Payer: Self-pay | Admitting: Emergency Medicine

## 2014-08-02 DIAGNOSIS — N6310 Unspecified lump in the right breast, unspecified quadrant: Secondary | ICD-10-CM

## 2014-08-02 NOTE — ED Notes (Signed)
[facility] [address] [address]

## 2014-08-08 ENCOUNTER — Ambulatory Visit: Payer: Medicaid Other

## 2014-08-08 ENCOUNTER — Ambulatory Visit
Admission: RE | Admit: 2014-08-08 | Discharge: 2014-08-08 | Disposition: A | Discharge status: Home / Self Care | Payer: Medicaid Other | Source: Ambulatory Visit | Attending: Emergency Medicine | Admitting: Emergency Medicine

## 2014-08-08 ENCOUNTER — Telehealth: Payer: Self-pay | Admitting: Family Medicine

## 2014-08-08 DIAGNOSIS — N6310 Unspecified lump in the right breast, unspecified quadrant: Secondary | ICD-10-CM

## 2014-08-08 NOTE — Telephone Encounter (Signed)
Wants [name] to call She was told her medicaid was pending and if her insurance wasn't going to pay there was no need to come She wants [name] to explain whats going on

## 2014-08-08 NOTE — Telephone Encounter (Signed)
Was able to get mammogram done Would like to get some more pain medication Please advise

## 2014-08-08 NOTE — Telephone Encounter (Signed)
Tried calling patient again, unable to leave message. Patient hasnt been seen here since November 2015, will have to be seen prior to getting any pain meds.

## 2014-08-08 NOTE — Telephone Encounter (Signed)
Attempted to call patient, voicemail full so unable to leave message. E-verified and medicaid appears to be active NOT pending. The problem is she only has family planning medicaid so any issues not related to that will not be covered. Please relay message to patient if she calls back.

## 2014-08-09 ENCOUNTER — Ambulatory Visit (INDEPENDENT_AMBULATORY_CARE_PROVIDER_SITE_OTHER): Payer: Medicaid Other | Admitting: Family Medicine

## 2014-08-09 VITALS — BP 146/110 | HR 113 | Temp 97.9°F | Ht 64.0 in | Wt 159.1 lb

## 2014-08-09 DIAGNOSIS — N63 Unspecified lump in breast: Secondary | ICD-10-CM | POA: Diagnosis not present

## 2014-08-09 DIAGNOSIS — Z111 Encounter for screening for respiratory tuberculosis: Secondary | ICD-10-CM

## 2014-08-09 DIAGNOSIS — N6310 Unspecified lump in the right breast, unspecified quadrant: Secondary | ICD-10-CM

## 2014-08-09 MED ORDER — OXYCODONE-ACETAMINOPHEN 5-325 MG PO TABS: 1.0000 | ORAL_TABLET | ORAL | Status: DC | PRN

## 2014-08-09 NOTE — Progress Notes (Signed)
I was preceptor the day of this visit.   

## 2014-08-09 NOTE — Patient Instructions (Signed)
Come back Friday for nurse visit to have TB test read.  If you haven't heard from breast center about ultrasound by Friday, call there or go to office to ask about it. Refilled pain medicine.  Schedule follow up visit with Dr. Richarda BladeAdamo in a few weeks to touch base on how you're doing.  Be well, Dr. Pollie MeyerMcIntyre

## 2014-08-09 NOTE — Progress Notes (Signed)
Patient ID: Ashley Jordan, female   DOB: [birth year], 44 y.o.   MRN: [id number]  HPI:  Pt presents for a same day appointment to discuss R arm/breast pain.  Has noticed swelling in her right axilla for approximately 6 months. On May 23 she went to the emergency room to be evaluated for this and a right breast mass. These areas especially in her armpit, were painful and she was given a prescription for Percocet. She requests a refill of this today as she is out. She is only taking this as needed.  Yesterday she obtained a mammogram, which showed a mass in the left inner quadrant of the LEFT breast. Also showed soft tissue protrusion and right axilla. Recommendation was for follow-up ultrasound, the patient has not yet been able to get this as they are awaiting prior approval from her insurance. She has both Medicaid and [employer]. This is quite frustrating for her, to be waiting for the study.  She denies having any fevers. Has had decreased appetite and some weight loss recently. She is able to squeeze some fluid out of her nipple which is new.  ROS: See HPI  PMFSH: History of genital herpes, asthma, smoking  PHYSICAL EXAM: BP 146/110 mmHg  Pulse 113  Temp(Src) 97.9 F (36.6 C) (Oral)  Ht 5\' 4"  (1.626 m)  Wt 159 lb 1.6 oz (72.167 kg)  BMI 27.30 kg/m2  LMP 07/31/2014 Gen: No acute distress, pleasant, cooperative HEENT: Normocephalic, atraumatic Breast: Palpable right axillary fullness with some sense of discrete mass. Palpable approximately 3 cm breast mass on the right at 7:00 position. Nipple texture slightly irregular on right side. No discharge expressed from nipple. Left axilla normal. Left breast with similar fullness/mass at 6:00 position.  ASSESSMENT/PLAN:  1. Breast pain and mass: Certainly concerning for malignancy given axillary involvement. This has already been bedside ultrasounded in the emergency room, and did not demonstrate any fluid collection. This does not appear to be an  abscess or anything that may respond anabiotics. Unfortunately, patient is awaiting insurance approval before she can get a formal breast ultrasound. Advised that I'm not able to make this go any faster. Recommended she stay in contact with breast center so that she can get this as soon as possible. I did refill her oxycodone, and discussed importance of using this only as needed. Advised she follow-up with her PCP in a few weeks to ensure she is getting adequate follow-up.  2. TB test: needs TB test for work. PPD placed today by nursing staff, pt to return Friday for nurse visit to have test read.  FOLLOW UP: F/u with PCP in a few weeks for above issue.  GrenadaBrittany J. Pollie MeyerMcIntyre, MD [facility]

## 2014-08-11 ENCOUNTER — Ambulatory Visit: Payer: Medicaid Other | Admitting: *Deleted

## 2014-08-11 ENCOUNTER — Other Ambulatory Visit: Payer: Self-pay | Admitting: Emergency Medicine

## 2014-08-11 DIAGNOSIS — N63 Unspecified lump in unspecified breast: Secondary | ICD-10-CM

## 2014-08-11 DIAGNOSIS — Z111 Encounter for screening for respiratory tuberculosis: Secondary | ICD-10-CM

## 2014-08-11 LAB — TB SKIN TEST
Induration: 0 mm
TB Skin Test: NEGATIVE

## 2014-08-11 NOTE — Progress Notes (Signed)
PPD Reading Note  PPD read and results entered in EpicCare.  Result: 0 mm induration.  Interpretation: negative  Allergic reaction: no

## 2014-08-11 NOTE — Addendum Note (Signed)
Addended by: Pamelia HoitBLOUNT, DESEREE C on: 08/11/2014 03:17 PM   Modules accepted: Orders

## 2014-08-17 ENCOUNTER — Other Ambulatory Visit: Payer: Medicaid Other

## 2014-08-17 ENCOUNTER — Ambulatory Visit: Payer: Medicaid Other | Admitting: Family Medicine

## 2014-08-18 ENCOUNTER — Encounter: Payer: Self-pay | Admitting: Family Medicine

## 2014-08-18 ENCOUNTER — Ambulatory Visit (INDEPENDENT_AMBULATORY_CARE_PROVIDER_SITE_OTHER): Payer: Medicaid Other | Admitting: Family Medicine

## 2014-08-18 VITALS — BP 125/79 | HR 94 | Temp 98.0°F | Ht 64.0 in | Wt 159.4 lb

## 2014-08-18 DIAGNOSIS — Z9181 History of falling: Secondary | ICD-10-CM | POA: Diagnosis not present

## 2014-08-18 DIAGNOSIS — N63 Unspecified lump in unspecified breast: Secondary | ICD-10-CM

## 2014-08-18 MED ORDER — OXYCODONE-ACETAMINOPHEN 5-325 MG PO TABS: 1.0000 | ORAL_TABLET | ORAL | Status: DC | PRN
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
## 2014-08-18 NOTE — Patient Instructions (Signed)
Thank you for coming in, today!  I will give you a letter about your housing today. I will refill your pain medicine for the breast mass. Make sure you get the ultrasound and everything done next week. After that, follow up with Dr. Richarda Blade as instructed.  Please feel free to call with any questions or concerns at any time, at [phone]. --Dr. Casper Harrison

## 2014-08-21 ENCOUNTER — Ambulatory Visit
Admission: RE | Admit: 2014-08-21 | Discharge: 2014-08-21 | Disposition: A | Discharge status: Home / Self Care | Payer: Medicaid Other | Source: Ambulatory Visit | Attending: Emergency Medicine | Admitting: Emergency Medicine

## 2014-08-21 DIAGNOSIS — N6310 Unspecified lump in the right breast, unspecified quadrant: Secondary | ICD-10-CM

## 2014-08-21 DIAGNOSIS — N63 Unspecified lump in unspecified breast: Secondary | ICD-10-CM

## 2014-08-22 NOTE — Progress Notes (Signed)
I was preceptor the day of this visit.   

## 2014-10-13 ENCOUNTER — Other Ambulatory Visit: Payer: Self-pay | Admitting: Family Medicine

## 2014-10-16 ENCOUNTER — Telehealth: Payer: Self-pay | Admitting: Family Medicine

## 2014-10-16 ENCOUNTER — Ambulatory Visit: Payer: Medicaid Other | Admitting: Family Medicine

## 2014-10-16 DIAGNOSIS — F411 Generalized anxiety disorder: Secondary | ICD-10-CM

## 2014-10-16 DIAGNOSIS — M545 Low back pain, unspecified: Secondary | ICD-10-CM

## 2014-10-16 MED ORDER — HYDROXYZINE PAMOATE 50 MG PO CAPS: 50.0000 mg | ORAL_CAPSULE | Freq: Every day | ORAL | Status: DC | PRN

## 2014-10-16 MED ORDER — OXYCODONE-ACETAMINOPHEN 5-325 MG PO TABS: 1.0000 | ORAL_TABLET | Freq: Four times a day (QID) | ORAL | Status: DC | PRN

## 2014-10-16 NOTE — Telephone Encounter (Signed)
Atarax sent to pharmacy and percocet and front desk for pickup. Thanks

## 2014-10-16 NOTE — Telephone Encounter (Signed)
Pt calling because she had an appt today to have her medication refilled. She had to make an urgent dentist appt for today, and rescheduled her appt for her refills to the next available day on 11/07/14. She would like a refill on hydrOXYzine (VISTARIL) 50 MG capsule and percocet to last her until then if possible. Please advise, Dorothey Baseman, ASA

## 2014-10-16 NOTE — Telephone Encounter (Signed)
Patient informed, expressed understanding. 

## 2014-11-07 ENCOUNTER — Ambulatory Visit: Payer: Medicaid Other | Admitting: Family Medicine

## 2014-11-12 IMAGING — US US OB TRANSVAGINAL
1 series · 13 of 28 positions shown · non-contrast
Comparison: none

[Series 1: us ob comp less 14 wks · 13 of 33 slices shown]
[im 2/33]
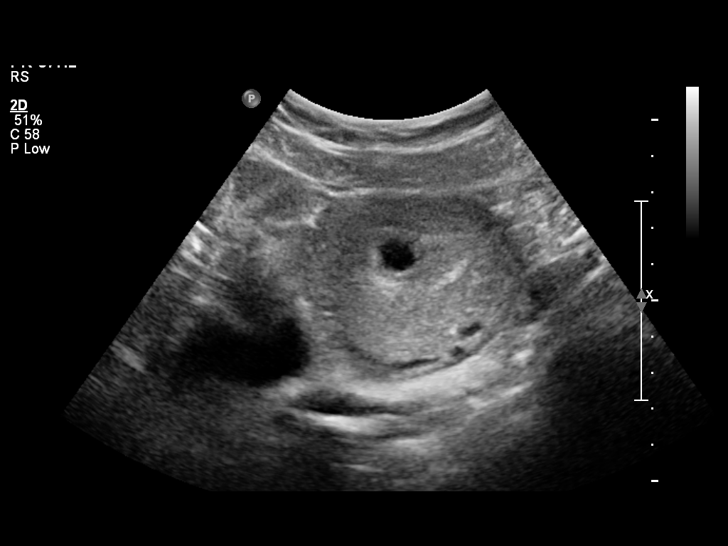
[im 4/33]
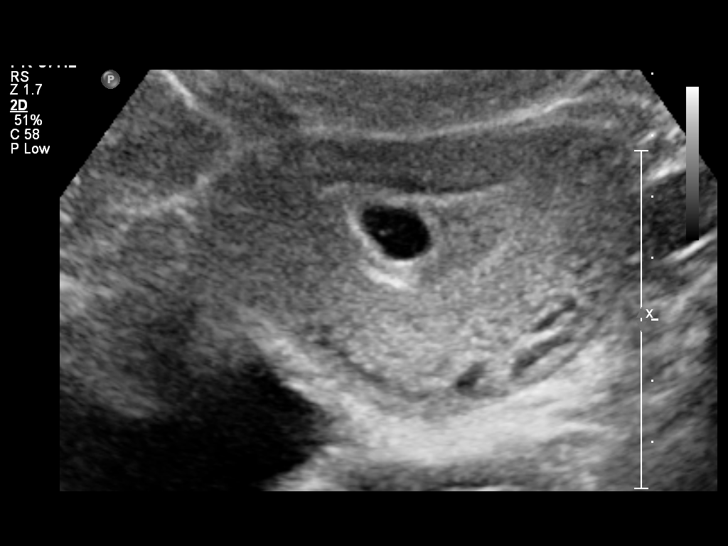
[im 6/33]
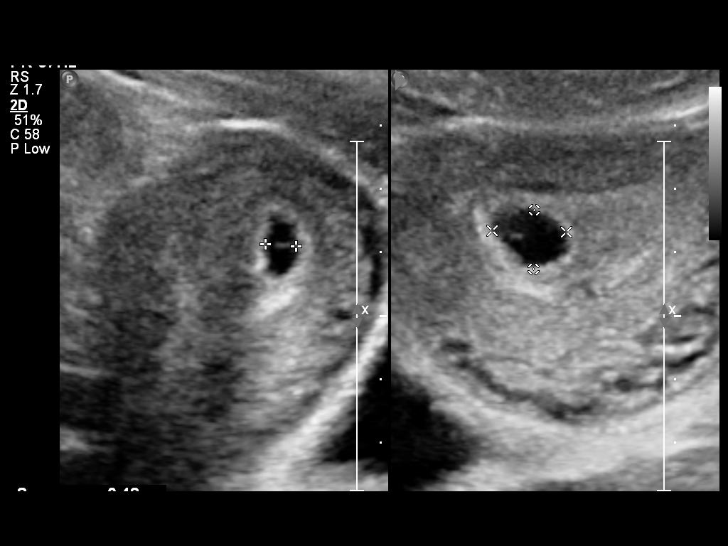
[im 9/33]
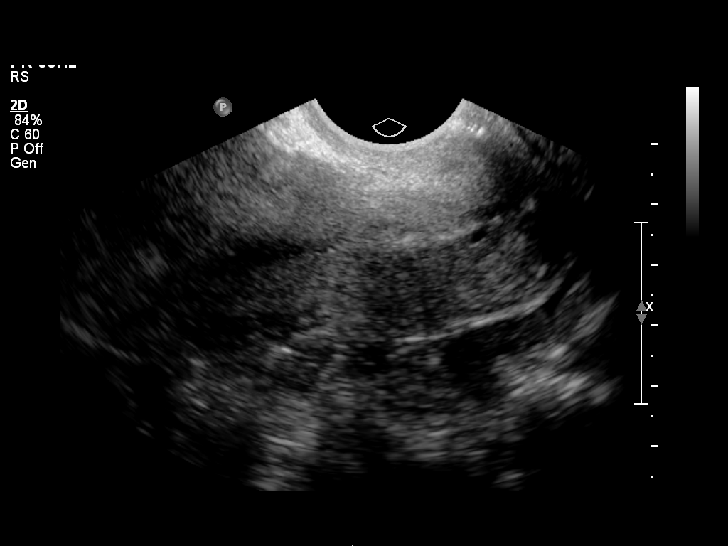
[im 11/33]
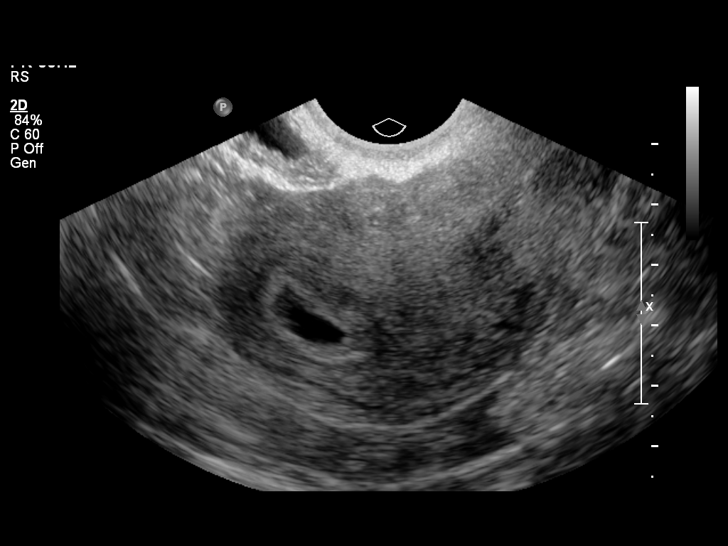
[im 14/33]
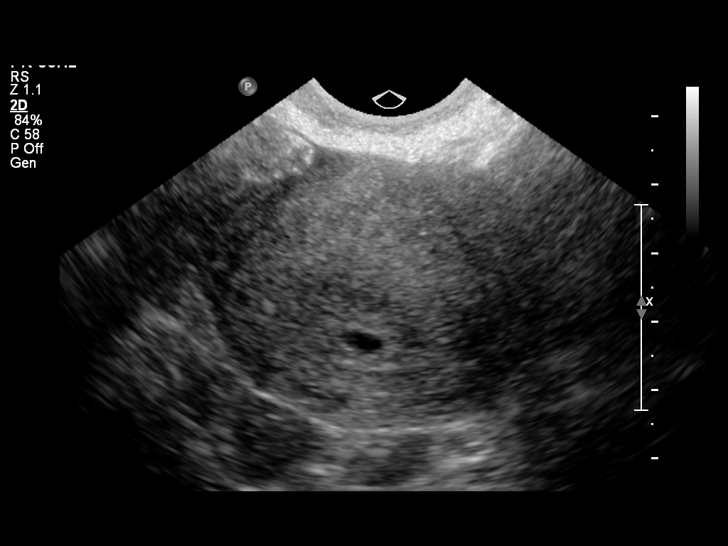
[im 17/33]
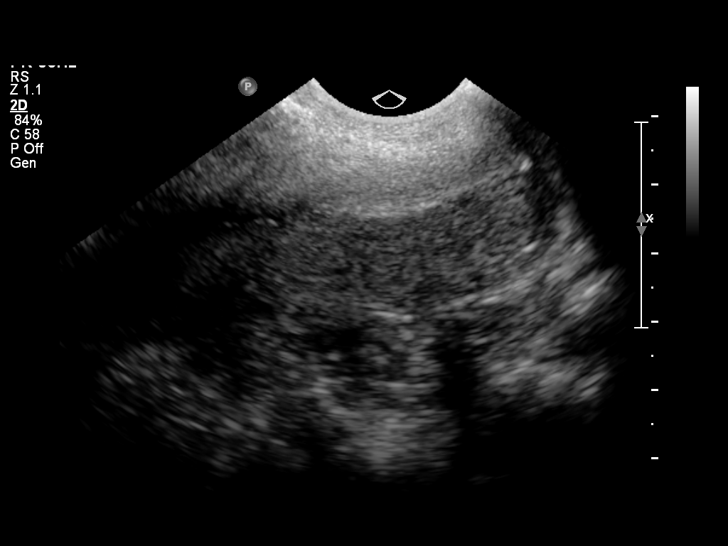
[im 19/33]
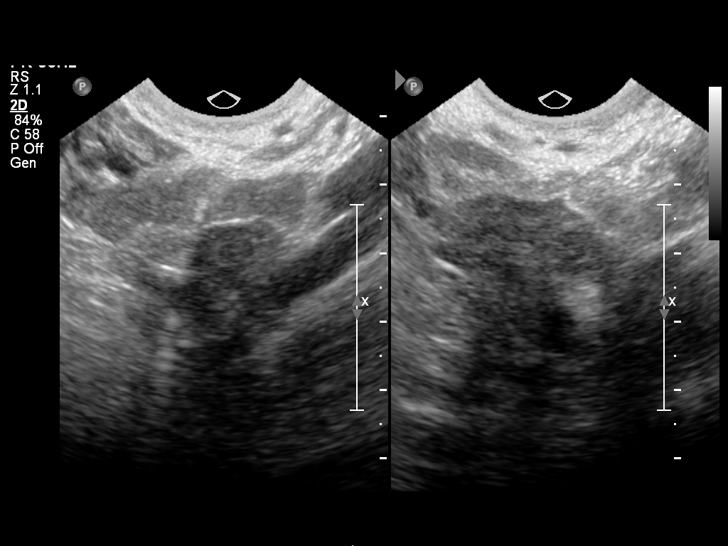
[im 22/33]
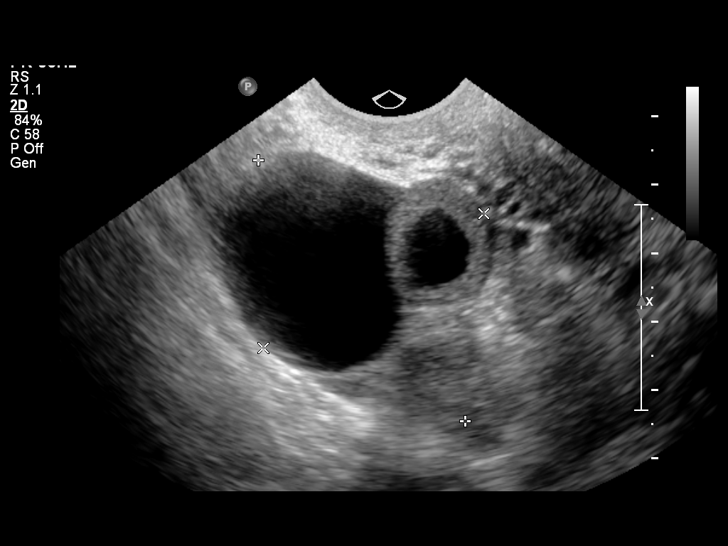
[im 24/33]
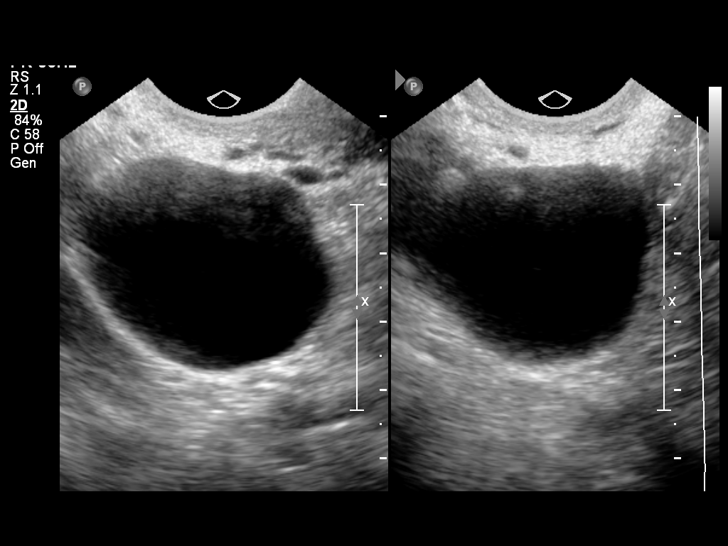
[im 27/33]
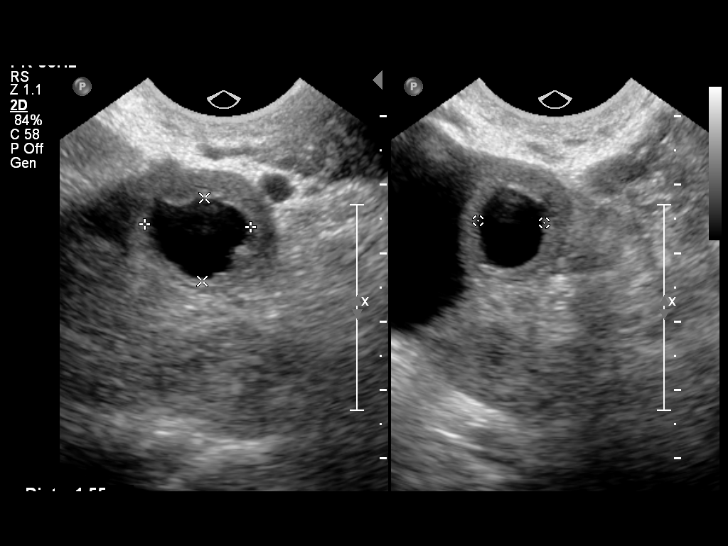
[im 29/33]
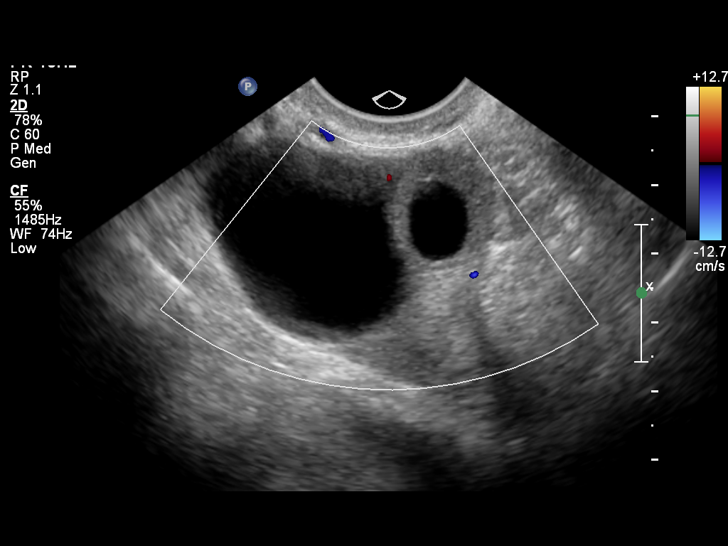
[im 31/33]
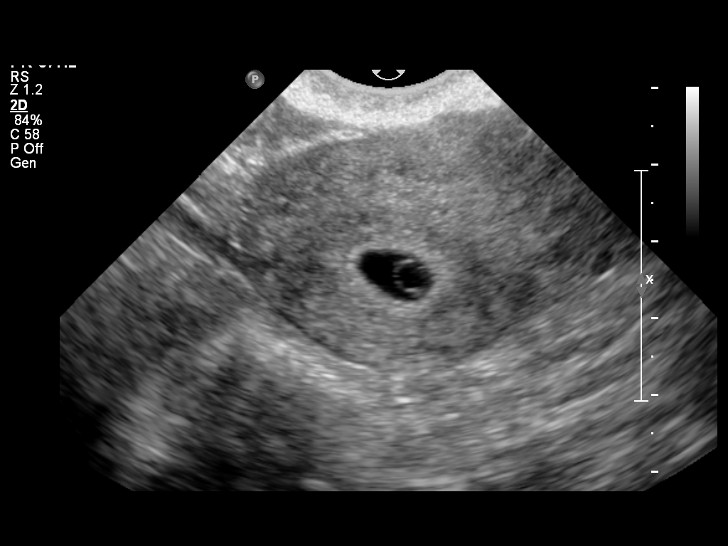

[13 of 28 positions shown; findings below may reference images not displayed]

OBSTETRICS REPORT
                      (Signed Final 05/03/2012 [DATE])

Service(s) Provided

 US OB COMP LESS 14 WKS                                76801.0
 US OB TRANSVAGINAL                                    76817.0
Indications

 Unsure of LMP;  Establish Gestational [AGE]
Fetal Evaluation

 Num Of Fetuses:    0
 Preg. Location:    Intrauterine
 Gest. Sac:         Intrauterine
 Yolk Sac:          Visualized
 Cardiac Activity:  No embryo visualized
Biometry

 GS:      10.2  mm    G. Age:   5w 5d      < 50  %     EDD:   12/29/12
Gestational Age

 Best:          5w 5d     Det. By:   U/S G S (05/03/12)       EDD:   12/29/12
Cervix Uterus Adnexa

 Cervix:       Normal appearance by transvaginal scan
 Uterus:       No abnormality visualized.
 Cul De Sac:   No free fluid seen.
 Left Ovary:   Within normal limits.
 Right Ovary:  Contains 3.4 x 2.9 x 3.2 cm cystic structure.
Impression

 Early intrauterine pregnancy. A gestational sac and yolk sac
 are visualized, suggesting an approximately 5w 5d
 gestational age. No embryo is visualized at this time, but not
 unexpected given the size of the gestational sac.
 The right ovary contains a mildly complex 3.4 x 2.9 x 3.2 cm
 cyst vs two adjacent simple cysts.
Recommendations

 Followup US in no earlier than 10 days to assess viability
 could be performed, if desired.
 Additionally, ultrasound for fetal anatomic evaluation at 18 to
 19 weeks gestational age is recommended.

 questions or concerns.

## 2014-12-08 ENCOUNTER — Encounter (HOSPITAL_COMMUNITY): Payer: Self-pay | Admitting: Emergency Medicine

## 2014-12-08 ENCOUNTER — Emergency Department (HOSPITAL_COMMUNITY)
Admission: EM | Admit: 2014-12-08 | Discharge: 2014-12-08 | Disposition: A | Discharge status: Home / Self Care | Payer: Medicaid Other | Attending: Emergency Medicine | Admitting: Emergency Medicine

## 2014-12-08 DIAGNOSIS — Z7951 Long term (current) use of inhaled steroids: Secondary | ICD-10-CM | POA: Diagnosis not present

## 2014-12-08 DIAGNOSIS — Z8632 Personal history of gestational diabetes: Secondary | ICD-10-CM | POA: Insufficient documentation

## 2014-12-08 DIAGNOSIS — Z792 Long term (current) use of antibiotics: Secondary | ICD-10-CM | POA: Diagnosis not present

## 2014-12-08 DIAGNOSIS — Z791 Long term (current) use of non-steroidal anti-inflammatories (NSAID): Secondary | ICD-10-CM | POA: Insufficient documentation

## 2014-12-08 DIAGNOSIS — Z720 Tobacco use: Secondary | ICD-10-CM | POA: Insufficient documentation

## 2014-12-08 DIAGNOSIS — K0381 Cracked tooth: Secondary | ICD-10-CM | POA: Diagnosis not present

## 2014-12-08 DIAGNOSIS — Z8619 Personal history of other infectious and parasitic diseases: Secondary | ICD-10-CM | POA: Insufficient documentation

## 2014-12-08 DIAGNOSIS — J45909 Unspecified asthma, uncomplicated: Secondary | ICD-10-CM | POA: Insufficient documentation

## 2014-12-08 DIAGNOSIS — Z79899 Other long term (current) drug therapy: Secondary | ICD-10-CM | POA: Diagnosis not present

## 2014-12-08 DIAGNOSIS — K088 Other specified disorders of teeth and supporting structures: Secondary | ICD-10-CM | POA: Insufficient documentation

## 2014-12-08 DIAGNOSIS — K0889 Other specified disorders of teeth and supporting structures: Secondary | ICD-10-CM

## 2014-12-08 MED ORDER — PENICILLIN V POTASSIUM 500 MG PO TABS: 500.0000 mg | ORAL_TABLET | Freq: Four times a day (QID) | ORAL | Status: DC

## 2014-12-08 MED ORDER — NAPROXEN 500 MG PO TABS: 500.0000 mg | ORAL_TABLET | Freq: Two times a day (BID) | ORAL | Status: DC

## 2014-12-08 MED ORDER — OXYCODONE-ACETAMINOPHEN 5-325 MG PO TABS
1.0000 | ORAL_TABLET | Freq: Once | ORAL | Status: AC
  Administered 2014-12-08: 1 via ORAL
  Filled 2014-12-08: qty 1

## 2014-12-08 NOTE — ED Notes (Signed)
Explained to pt that EDPA cannot prescribe narcotics for pain.  Pt upset and states that the reason why she is here is because she has been taking naproxen and is not helping.  States she is calling her PCP.

## 2014-12-08 NOTE — Discharge Instructions (Signed)
Take the prescribed medication as directed. Follow-up with your dentist as soon as possible. Return to the ED for new or worsening symptoms. 

## 2014-12-08 NOTE — ED Provider Notes (Signed)
CSN: [id number]     Arrival date & time 12/08/14  1259 History   By signing my name below, I, Tanda Rockers, attest that this documentation has been prepared under the direction and in the presence of Sharilyn Sites, PA-C. Electronically Signed: Tanda Rockers, ED Scribe. 12/08/2014. 1:24 PM.  Chief Complaint  Patient presents with  . Dental Pain   The history is provided by the patient. No language interpreter was used.     HPI Comments: Ashley Jordan is a 44 y.o. female who presents to the Emergency Department complaining of gradual onset, constant, severe, right upper dental pain x 6 months, worsening the past week. Pt states the pain is down to the nerve. The pain is exacerbated with exposure to hot and cold.  She had consultation scheduled with oral surgeon to have dentures placed but had issues with her insurance. She states she has gotten her insurance issues resolved but now has to go see the dentist first and get another consultation scheduled. Pt states she has been unable to eat or sleep due to the pain. She took 1200 mg Ibuprofen and Cipro last night which finally let her sleep. Denies any other associated symptoms.   Past Medical History  Diagnosis Date  . Asthma   . Genital herpes   . Gestational diabetes    Past Surgical History  Procedure Laterality Date  . Induced abortion     Family History  Problem Relation Age of Onset  . Hypertension Mother   . Diabetes Father    Social History  Substance Use Topics  . Smoking status: Current Some Day Smoker -- 0.30 packs/day    Types: Cigarettes  . Smokeless tobacco: Current User    Types: Chew     Comment: occasional smoker  . Alcohol Use: 0.0 oz/week    0 Standard drinks or equivalent per week     Comment: social   OB History    Gravida Para Term Preterm AB TAB SAB Ectopic Multiple Living   3 1 0 Review of Systems  HENT: Positive for dental problem.   All other systems reviewed and are  negative.  Allergies  Septra and Tramadol  Home Medications   Prior to Admission medications   Medication Sig Start Date End Date Taking? Authorizing Provider  albuterol (PROVENTIL HFA;VENTOLIN HFA) 108 (90 BASE) MCG/ACT inhaler Inhale 2 puffs into the lungs every 6 (six) hours as needed for wheezing. 10/14/13   Tommie Sams, DO  cephALEXin (KEFLEX) 500 MG capsule Take 1 capsule (500 mg total) by mouth 4 (four) times daily. 05/07/14   Elpidio Anis, PA-C  cetirizine (ZYRTEC) 10 MG tablet Take 1 tablet (10 mg total) by mouth daily. Patient taking differently: Take 10 mg by mouth daily as needed for allergies.  12/31/11   Charm Rings, MD  clindamycin (CLEOCIN) 150 MG capsule Take 3 capsules (450 mg total) by mouth 3 (three) times daily. 05/10/14   Charlestine Night, PA-C  fluconazole (DIFLUCAN) 150 MG tablet Take one pill (150 mg) one time at the first symptoms of yeast infection. Repeat in 1 week if needed. Patient not taking: Reported on 05/07/2014 09/21/13   Stephanie Coup Street, MD  fluticasone [employer]) 50 MCG/ACT nasal spray Place 2 sprays into both nostrils daily.  07/10/10   Cat Ta, MD  Fluticasone-Salmeterol (ADVAIR) 500-50 MCG/DOSE AEPB Inhale 1 puff into the lungs every 12 (twelve) hours. Patient not taking: Reported  on 05/10/2014 07/22/12   Charm Rings, MD  hydrOXYzine (VISTARIL) 50 MG capsule Take 1 capsule (50 mg total) by mouth daily as needed for anxiety or itching. 10/16/14   Abram Sander, MD  ibuprofen (ADVIL,MOTRIN) 200 MG tablet Take 200 mg by mouth every 6 (six) hours as needed for moderate pain (pain).    Historical Provider, MD  ibuprofen (ADVIL,MOTRIN) 600 MG tablet Take 600 mg by mouth every 6 (six) hours as needed for moderate pain (pain).    Historical Provider, MD  meloxicam (MOBIC) 15 MG tablet Take 1 tablet (15 mg total) by mouth daily. Patient taking differently: Take 15 mg by mouth daily as needed for pain (pain).  02/06/14   Tyrone Nine, MD  oxyCODONE-acetaminophen  (PERCOCET/ROXICET) 5-325 MG per tablet Take 1-2 tablets by mouth every 6 (six) hours as needed for severe pain. 10/16/14   Abram Sander, MD  predniSONE (DELTASONE) 50 MG tablet Take 1 tablet daily for 5 days. Patient not taking: Reported on 05/07/2014 07/14/13   Charm Rings, MD  valACYclovir (VALTREX) 1000 MG tablet Take 1 tablet (1,000 mg total) by mouth 2 (two) times daily. For 3 days at start of outbreak Patient not taking: Reported on 05/10/2014 03/23/13   Charm Rings, MD   Triage Vitals: BP 131/97 mmHg  Pulse 87  Temp(Src) 99 F (37.2 C) (Oral)  Resp 16  SpO2 97%   Physical Exam  Constitutional: She is oriented to person, place, and time. She appears well-developed and well-nourished.  HENT:  Head: Normocephalic and atraumatic.  Mouth/Throat: Oropharynx is clear and moist.  Teeth largely in poor dentition,  Multiple teeth have been extracted, right upper pre-molar is broken with defect along anterior aspect of tooth, surrounding gingiva normal in appearance without signs of abscess formation, handling secretions appropriately, no trismus  Eyes: Conjunctivae and EOM are normal. Pupils are equal, round, and reactive to light.  Neck: Normal range of motion.  Cardiovascular: Normal rate, regular rhythm and normal heart sounds.   Pulmonary/Chest: Effort normal and breath sounds normal.  Abdominal: Soft. Bowel sounds are normal.  Musculoskeletal: Normal range of motion.  Neurological: She is alert and oriented to person, place, and time.  Skin: Skin is warm and dry.  Psychiatric: She has a normal mood and affect.  Nursing note and vitals reviewed.   ED Course  Procedures (including critical care time)  DIAGNOSTIC STUDIES: Oxygen Saturation is 97% on RA, normal by my interpretation.    COORDINATION OF CARE: 1:23 PM-Discussed treatment plan which includes pain medication with pt at bedside and pt agreed to plan.   Labs Review Labs Reviewed - No data to display  Imaging Review No  results found.   EKG Interpretation None      MDM   Final diagnoses:  Pain, dental   44 y.o. F here with dental pain x 6 months, awaiting to see oral surgeon for dentures.  Patient afebrile, non-toxic.  Evidence of dental fracture without signs of abscess formation or infection.  No facial or neck swelling to suggest ludwig's angina.  Patient has concern for infection-- will start on penicillin and naprosyn.  FU with dentist next week.  Discussed plan with patient, he/she acknowledged understanding and agreed with plan of care.  Return precautions given for new or worsening symptoms.  I personally performed the services described in this documentation, which was scribed in my presence. The recorded information has been reviewed and is accurate.  Garlon Hatchet,  PA-C 12/08/14 1433  Alvira Monday, MD 12/11/14 1442

## 2014-12-08 NOTE — ED Notes (Signed)
Pt reports R upper dental pain for the past 6 months, that has gotten worse in the past week. Pt is trying to get a consult to oral surgery to get dentures, but dentist did not have opening today.

## 2014-12-08 NOTE — ED Notes (Signed)
During discharge, patient demanded to speak with provider. Pt was unhappy prescription naproxen, and wanted more percocet. Informed provider.

## 2015-04-11 ENCOUNTER — Other Ambulatory Visit: Payer: Self-pay | Admitting: *Deleted

## 2015-04-11 MED ORDER — VALACYCLOVIR HCL 1 G PO TABS: 1000.0000 mg | ORAL_TABLET | Freq: Two times a day (BID) | ORAL | Status: DC

## 2015-05-16 ENCOUNTER — Telehealth: Payer: Self-pay | Admitting: Family Medicine

## 2015-05-16 DIAGNOSIS — F411 Generalized anxiety disorder: Secondary | ICD-10-CM

## 2015-05-16 MED ORDER — HYDROXYZINE PAMOATE 50 MG PO CAPS: 50.0000 mg | ORAL_CAPSULE | Freq: Every day | ORAL | Status: DC | PRN

## 2015-05-16 NOTE — Telephone Encounter (Addendum)
Patient here during her daughter's appointment and requesting refill on hydroxyzine. I gave her a 30 day supply and asked that she follow up with PCP. Patient agreeable.  Ashley DodrillBrittany J Pat Sires, MD

## 2015-06-04 ENCOUNTER — Encounter: Payer: Medicaid Other | Admitting: Family Medicine

## 2015-06-05 ENCOUNTER — Ambulatory Visit (INDEPENDENT_AMBULATORY_CARE_PROVIDER_SITE_OTHER): Payer: Medicaid Other | Admitting: Internal Medicine

## 2015-06-05 ENCOUNTER — Other Ambulatory Visit: Payer: Self-pay | Admitting: Family Medicine

## 2015-06-05 VITALS — BP 131/76 | HR 118 | Temp 98.0°F | Wt 169.0 lb

## 2015-06-05 DIAGNOSIS — N912 Amenorrhea, unspecified: Secondary | ICD-10-CM | POA: Diagnosis not present

## 2015-06-05 DIAGNOSIS — Z113 Encounter for screening for infections with a predominantly sexual mode of transmission: Secondary | ICD-10-CM | POA: Insufficient documentation

## 2015-06-05 DIAGNOSIS — F411 Generalized anxiety disorder: Secondary | ICD-10-CM | POA: Diagnosis not present

## 2015-06-05 DIAGNOSIS — Z1159 Encounter for screening for other viral diseases: Secondary | ICD-10-CM | POA: Diagnosis not present

## 2015-06-05 DIAGNOSIS — M545 Low back pain, unspecified: Secondary | ICD-10-CM

## 2015-06-05 LAB — POCT URINE PREGNANCY: PREG TEST UR: NEGATIVE

## 2015-06-05 MED ORDER — ALBUTEROL SULFATE HFA 108 (90 BASE) MCG/ACT IN AERS: 2.0000 | INHALATION_SPRAY | Freq: Four times a day (QID) | RESPIRATORY_TRACT | Status: DC | PRN

## 2015-06-05 MED ORDER — FLUTICASONE PROPIONATE 50 MCG/ACT NA SUSP: 2.0000 | Freq: Every day | NASAL | Status: AC

## 2015-06-05 MED ORDER — HYDROXYZINE PAMOATE 50 MG PO CAPS
50.0000 mg | ORAL_CAPSULE | Freq: Every day | ORAL | Status: DC | PRN
Start: 2015-06-05 — End: 2016-04-29

## 2015-06-05 MED ORDER — OXYCODONE-ACETAMINOPHEN 5-325 MG PO TABS: 1.0000 | ORAL_TABLET | Freq: Four times a day (QID) | ORAL | Status: DC | PRN

## 2015-06-05 MED ORDER — CETIRIZINE HCL 10 MG PO TABS: 10.0000 mg | ORAL_TABLET | Freq: Every day | ORAL | Status: DC | PRN

## 2015-06-05 MED ORDER — FLUTICASONE-SALMETEROL 500-50 MCG/DOSE IN AEPB: 1.0000 | INHALATION_SPRAY | Freq: Two times a day (BID) | RESPIRATORY_TRACT | Status: AC

## 2015-06-05 NOTE — Progress Notes (Signed)
[facility] Phone: [phone]  Subjective:  Ashley Jordan: She has not had a period since December 12. She has taken 3 home pregnancy tests, which were negative. She is sexually active with one partner. She uses condoms every time. She did note that the condom broke on New Year's. She does not use any other type of birth control. She has had cramps, back pain, breast tenderness, and has been in a bad mood right around the time that her period is supposed to start, but she never has the period. She has not noticed any vaginal lesions or discharge. Her mother didn't go through menopause until she was 5262. She states she has had a lot of stress lately, because she is waiting to find out if she has breast cancer. She has also had a lot of financial difficulties recently. No excessive exercise, no galactorrhea, no hirsutism.   Hepatitis C: She was giving blood and she found out that she has Hepatitis C. She would like know if she definitely has Hepatitis C. She denies any RUQ pain.   ROS: See HPI for pertinent positives and negatives Past Medical History- moderate persistent asthma, HSV-2, tobacco abuse Reviewed problem list.  Medications- reviewed and updated Current Outpatient Prescriptions  Medication Sig Dispense Refill  . albuterol (PROVENTIL HFA;VENTOLIN HFA) 108 (90 Base) MCG/ACT inhaler Inhale 2 puffs into the lungs every 6 (six) hours as needed for wheezing. 1 Inhaler 2  . cetirizine (ZYRTEC) 10 MG tablet Take 1 tablet (10 mg total) by mouth daily as needed for allergies. 30 tablet 11  . fluticasone (FLONASE) 50 MCG/ACT nasal spray Place 2 sprays into both nostrils daily. 16 g 2  . Fluticasone-Salmeterol (ADVAIR) 500-50 MCG/DOSE AEPB Inhale 1 puff into the lungs every 12 (twelve) hours. 60 each 6  . hydrOXYzine (VISTARIL) 50 MG capsule Take 1 capsule (50 mg total) by mouth daily as needed for anxiety or itching. 30 capsule 0  . ibuprofen (ADVIL,MOTRIN) 200 MG tablet  Take 200 mg by mouth every 6 (six) hours as needed for moderate pain (pain).    Marland Kitchen. ibuprofen (ADVIL,MOTRIN) 600 MG tablet Take 600 mg by mouth every 6 (six) hours as needed for moderate pain (pain).    Marland Kitchen. oxyCODONE-acetaminophen (PERCOCET/ROXICET) 5-325 MG tablet Take 1-2 tablets by mouth every 6 (six) hours as needed for severe pain. 40 tablet 0  . predniSONE (DELTASONE) 50 MG tablet Take 1 tablet daily for 5 days. (Patient not taking: Reported on 05/07/2014) 5 tablet 0  . valACYclovir (VALTREX) 1000 MG tablet Take 1 tablet (1,000 mg total) by mouth 2 (two) times daily. For 3 days at start of outbreak 30 tablet 0   No current facility-administered medications for this visit.   Chief complaint-noted Family history reviewed for today's visit. No changes. Social history- patient is a never smoker  Objective: BP 131/76 mmHg  Pulse 118  Temp(Src) 98 F (36.7 C) (Oral)  Wt 169 lb (76.658 kg) Gen: NAD, alert, cooperative with exam HEENT: NCAT, EOMI, MMM Neck: FROM, supple CV: RRR, no murmur Resp: CTABL, no wheezes, normal work of breathing GI: SNTND, BS present, no guarding or organomegaly Msk: No edema, warm, normal tone, moves UE/LE spontaneously Neuro: Alert and oriented, no gross deficits Skin: No rashes, no lesions, no jaundice Psych: Appropriate behavior  Assessment/Plan: Ashley Jordan: Pt has not had a period for the last 4 months. Three home pregnancy tests have been negative. Likely related to stress, as Pt states she is currently waiting to  find out if she has breast cancer. Pt's mother did not go through menopause until age 53, so early menopause is less likely.  - Pregnancy test performed in clinic was negative - Would like to see if menstrual cycle returns when Pt is under less stress - Will consider FSH, E2, TSH, prolactin in the future - Pt will follow-up in the next month for CPE and Pap  Screening for Hepatitis C and HIV: Pt told she has Hepatitis C when she was giving  blood. She would also like to be tested for HIV today. On exam, no jaundice, no RUQ pain, no hepatomegaly. - Will order Hep C screen today - Follow-up for CPE and Pap   Willadean Carol, MD PGY-1

## 2015-06-05 NOTE — Assessment & Plan Note (Signed)
Pt told she has Hepatitis C when she was giving blood. She would also like to be tested for HIV today. On exam, no jaundice, no RUQ pain, no hepatomegaly. - Will order Hep C screen today

## 2015-06-05 NOTE — Patient Instructions (Signed)
It was so nice to meet you!  I will call you with the results of your test. They should be back in a few days.   Please make an appointment to see your primary doctor for a physical exam and pap.  -Dr. Nancy MarusMayo

## 2015-06-06 LAB — COMPREHENSIVE METABOLIC PANEL
ALBUMIN: 3.9 g/dL (ref 3.6–5.1)
ALK PHOS: 99 U/L (ref 33–115)
ALT: 51 U/L — ABNORMAL HIGH (ref 6–29)
AST: 44 U/L — AB (ref 10–30)
BUN: 13 mg/dL (ref 7–25)
CO2: 24 mmol/L (ref 20–31)
CREATININE: 1.22 mg/dL — AB (ref 0.50–1.10)
Calcium: 9 mg/dL (ref 8.6–10.2)
Chloride: 105 mmol/L (ref 98–110)
GLUCOSE: 93 mg/dL (ref 65–99)
Potassium: 4.2 mmol/L (ref 3.5–5.3)
Sodium: 139 mmol/L (ref 135–146)
Total Bilirubin: 0.4 mg/dL (ref 0.2–1.2)
Total Protein: 7.6 g/dL (ref 6.1–8.1)

## 2015-06-06 LAB — HEPATITIS C ANTIBODY: HCV Ab: REACTIVE — AB

## 2015-06-06 LAB — HIV ANTIBODY (ROUTINE TESTING W REFLEX): HIV 1&2 Ab, 4th Generation: NONREACTIVE

## 2015-06-07 ENCOUNTER — Other Ambulatory Visit: Payer: Self-pay | Admitting: Internal Medicine

## 2015-06-07 DIAGNOSIS — R768 Other specified abnormal immunological findings in serum: Secondary | ICD-10-CM

## 2015-06-07 LAB — HEPATITIS C RNA QUANTITATIVE

## 2015-06-08 LAB — HEPATITIS C GENOTYPE

## 2015-06-14 ENCOUNTER — Other Ambulatory Visit: Payer: Medicaid Other

## 2015-06-14 DIAGNOSIS — R768 Other specified abnormal immunological findings in serum: Secondary | ICD-10-CM

## 2015-06-14 NOTE — Progress Notes (Signed)
Labs done today Ashley Jordan 

## 2015-06-15 LAB — HEPATITIS C RNA QUANTITATIVE
HCV QUANT LOG: 6.98 {Log} — AB (ref <1.18)
HCV Quantitative: 9519047 IU/mL — ABNORMAL HIGH (ref <15)

## 2015-06-18 LAB — HEPATITIS C GENOTYPE

## 2015-07-17 ENCOUNTER — Other Ambulatory Visit: Payer: Self-pay | Admitting: Family Medicine

## 2015-07-17 DIAGNOSIS — M545 Low back pain, unspecified: Secondary | ICD-10-CM

## 2015-07-17 NOTE — Telephone Encounter (Signed)
Pt called because she needs a refill on her Percocet left up front for pick up. She is suppose to be changing to Dr. Nancy MarusMayo and that who she has been seeing and writing her prescriptions. She would also like to know what her Hep C results are. Myriam Jacobsonjw

## 2015-07-17 NOTE — Telephone Encounter (Signed)
Will forward to Dr. Nancy MarusMayo who did labs for hep c.  Pcp is still listed as Dr. Richarda BladeAdamo. Jazmin Hartsell,CMA

## 2015-07-20 MED ORDER — OXYCODONE-ACETAMINOPHEN 5-325 MG PO TABS: 1.0000 | ORAL_TABLET | Freq: Four times a day (QID) | ORAL | Status: DC | PRN

## 2015-07-20 NOTE — Telephone Encounter (Signed)
Patient is aware of labs and also that script is ready for pick up. Ashley Jordan,CMA

## 2015-07-26 ENCOUNTER — Telehealth: Payer: Self-pay | Admitting: Internal Medicine

## 2015-07-26 NOTE — Telephone Encounter (Signed)
Will forward to MD.  Patient was here in office recently for the same issues. Emerald Shor,CMA

## 2015-07-26 NOTE — Telephone Encounter (Signed)
Please call patient about pain to rt side.

## 2015-08-08 ENCOUNTER — Other Ambulatory Visit: Payer: Medicaid Other

## 2015-08-13 ENCOUNTER — Other Ambulatory Visit: Payer: Self-pay | Admitting: Internal Medicine

## 2015-08-13 DIAGNOSIS — M545 Low back pain, unspecified: Secondary | ICD-10-CM

## 2015-08-13 NOTE — Telephone Encounter (Signed)
Pt called because she needs a refill on her Percocet left up front. She also wanted the doctor to know that she has an up coming appointment with [facility]. Ashley Jacobsonjw

## 2015-08-15 MED ORDER — OXYCODONE-ACETAMINOPHEN 5-325 MG PO TABS: 1.0000 | ORAL_TABLET | Freq: Four times a day (QID) | ORAL | Status: DC | PRN

## 2015-08-15 NOTE — Telephone Encounter (Signed)
Pt informed of rx waiting for her up front. Page, cma.  

## 2015-08-15 NOTE — Telephone Encounter (Signed)
Please let Ms. Schnackenberg know that I have left her medication at the front desk. Thank you!

## 2015-08-15 NOTE — Telephone Encounter (Signed)
Medication refilled. Goal to stop this medication after Pt starts treatment for Hepatitis C infection.

## 2015-08-21 ENCOUNTER — Other Ambulatory Visit: Payer: Medicaid Other

## 2015-08-21 DIAGNOSIS — B182 Chronic viral hepatitis C: Secondary | ICD-10-CM

## 2015-08-21 LAB — PROTIME-INR
INR: 1
PROTHROMBIN TIME: 10.2 s (ref 9.0–11.5)

## 2015-08-22 LAB — CBC WITH DIFFERENTIAL/PLATELET
BASOS PCT: 0 %
Basophils Absolute: 0 cells/uL (ref 0–200)
EOS ABS: 378 {cells}/uL (ref 15–500)
EOS PCT: 7 %
HCT: 41 % (ref 35.0–45.0)
Hemoglobin: 13.6 g/dL (ref 11.7–15.5)
LYMPHS PCT: 35 %
Lymphs Abs: 1890 cells/uL (ref 850–3900)
MCH: 29.8 pg (ref 27.0–33.0)
MCHC: 33.2 g/dL (ref 32.0–36.0)
MCV: 89.9 fL (ref 80.0–100.0)
MONOS PCT: 10 %
MPV: 10.7 fL (ref 7.5–12.5)
Monocytes Absolute: 540 cells/uL (ref 200–950)
NEUTROS ABS: 2592 {cells}/uL (ref 1500–7800)
Neutrophils Relative %: 48 %
PLATELETS: 258 10*3/uL (ref 140–400)
RBC: 4.56 MIL/uL (ref 3.80–5.10)
RDW: 14.2 % (ref 11.0–15.0)
WBC: 5.4 10*3/uL (ref 3.8–10.8)

## 2015-08-22 LAB — HEPATITIS B SURFACE ANTIBODY,QUALITATIVE: HEP B S AB: POSITIVE — AB

## 2015-08-22 LAB — HEPATITIS A ANTIBODY, TOTAL: HEP A TOTAL AB: NONREACTIVE

## 2015-08-22 LAB — HEPATITIS B SURFACE ANTIGEN: Hepatitis B Surface Ag: NEGATIVE

## 2015-08-22 LAB — AFP TUMOR MARKER: AFP TUMOR MARKER: 6.7 ng/mL — AB (ref <6.1)

## 2015-08-22 LAB — HEPATITIS B CORE ANTIBODY, TOTAL: Hep B Core Total Ab: NONREACTIVE

## 2015-08-24 LAB — LIVER FIBROSIS, FIBROTEST-ACTITEST
ALPHA-2-MACROGLOBULIN: 202 mg/dL (ref 106–279)
ALT: 36 U/L — ABNORMAL HIGH (ref 6–29)
Apolipoprotein A1: 232 mg/dL — ABNORMAL HIGH (ref 101–198)
BILIRUBIN: 0.6 mg/dL (ref 0.2–1.2)
FIBROSIS SCORE: 0.12
GGT: 112 U/L — ABNORMAL HIGH (ref 3–55)
Haptoglobin: 115 mg/dL (ref 43–212)
NECROINFLAMMAT ACT SCORE: 0.15
REFERENCE ID: 1551136

## 2015-08-25 LAB — HCV RNA NS5A DRUG RESISTANCE

## 2015-09-05 ENCOUNTER — Encounter: Payer: Self-pay | Admitting: Internal Medicine

## 2015-09-05 ENCOUNTER — Ambulatory Visit (INDEPENDENT_AMBULATORY_CARE_PROVIDER_SITE_OTHER): Payer: Medicaid Other | Admitting: Internal Medicine

## 2015-09-05 VITALS — BP 127/81 | HR 106 | Temp 97.4°F | Wt 169.0 lb

## 2015-09-05 DIAGNOSIS — B182 Chronic viral hepatitis C: Secondary | ICD-10-CM | POA: Diagnosis not present

## 2015-09-05 MED ORDER — LEDIPASVIR-SOFOSBUVIR 90-400 MG PO TABS: 1.0000 | ORAL_TABLET | Freq: Every day | ORAL | Status: DC

## 2015-09-05 NOTE — Progress Notes (Signed)
[facility]   CC: consideration for treatment for chronic hepatitis C  HPI:  +Ashley Jordan is a 45 y.o. female who presents for initial evaluation and management of chronic hepatitis C.  Patient tested positive while donating blood this year. Hepatitis C-associated risk factors present are: none. Patient denies history of blood transfusion, intranasal drug use, IV drug abuse, multiple sexual partners, renal dialysis, sexual contact with person with liver disease. Patient has had other studies performed. Results: hepatitis C RNA by PCR, result: positive. Patient has not had prior treatment for Hepatitis C. Patient does not have a past history of liver disease. Patient does not have a family history of liver disease. Patient does not  have associated signs or symptoms related to liver disease.  Labs reviewed and confirm chronic hepatitis C with a positive viral load.   Records reviewed from PCP and has had persistent RUQ pain that she equates with her hepatitis C diagnosis.   She also thinks she has PTSD from her time in the [employer].  Is going to go to a psychiatrist.        Patient does not have documented immunity to Hepatitis A. Patient does have documented immunity to Hepatitis B.    Review of Systems:   Constitutional: negative for fatigue and malaise Gastrointestinal: negative for diarrhea Musculoskeletal: negative for myalgias and arthralgias All other systems reviewed and are negative      Past Medical History  Diagnosis Date  . Asthma   . Genital herpes   . Gestational diabetes     Prior to Admission medications   Medication Sig Start Date End Date Taking? Authorizing Provider  albuterol (PROVENTIL HFA;VENTOLIN HFA) 108 (90 Base) MCG/ACT inhaler Inhale 2 puffs into the lungs every 6 (six) hours as needed for wheezing. 06/05/15  Yes Campbell StallKaty Dodd Mayo, MD  cetirizine (ZYRTEC) 10 MG tablet Take 1 tablet (10 mg total) by mouth daily as needed for  allergies. 06/05/15  Yes Campbell StallKaty Dodd Mayo, MD  fluticasone [facility](FLONASE) 50 MCG/ACT nasal spray Place 2 sprays into both nostrils daily. 06/05/15  Yes Campbell StallKaty Dodd Mayo, MD  Fluticasone-Salmeterol (ADVAIR) 500-50 MCG/DOSE AEPB Inhale 1 puff into the lungs every 12 (twelve) hours. 06/05/15  Yes Campbell StallKaty Dodd Mayo, MD  hydrOXYzine (VISTARIL) 50 MG capsule Take 1 capsule (50 mg total) by mouth daily as needed for anxiety or itching. 06/05/15  Yes Campbell StallKaty Dodd Mayo, MD  ibuprofen (ADVIL,MOTRIN) 200 MG tablet Take 200 mg by mouth every 6 (six) hours as needed for moderate pain (pain).   Yes Historical Provider, MD  ibuprofen (ADVIL,MOTRIN) 600 MG tablet Take 600 mg by mouth every 6 (six) hours as needed for moderate pain (pain).   Yes Historical Provider, MD  oxyCODONE-acetaminophen (PERCOCET/ROXICET) 5-325 MG tablet Take 1-2 tablets by mouth every 6 (six) hours as needed for severe pain. 08/15/15  Yes Campbell StallKaty Dodd Mayo, MD  valACYclovir (VALTREX) 1000 MG tablet Take 1 tablet (1,000 mg total) by mouth 2 (two) times daily. For 3 days at start of outbreak 04/11/15  Yes Abram SanderElena M Adamo, MD  Ledipasvir-Sofosbuvir (HARVONI) 90-400 MG TABS Take 1 tablet by mouth daily. 09/05/15   Gardiner Barefootobert W Comer, MD  predniSONE (DELTASONE) 50 MG tablet Take 1 tablet daily for 5 days. Patient not taking: Reported on 05/07/2014 07/14/13   Charm RingsErin J Honig, MD    Allergies  Allergen Reactions  . Septra [Bactrim] Shortness Of Breath  . Tramadol Shortness Of Breath    "Feels  like my chest is closing up"    Social History  Substance Use Topics  . Smoking status: Current Some Day Smoker -- 0.30 packs/day    Types: Cigarettes  . Smokeless tobacco: Current User    Types: Chew     Comment: occasional smoker  . Alcohol Use: 0.0 oz/week    0 Standard drinks or equivalent per week     Comment: social    Family History  Problem Relation Age of Onset  . Hypertension Mother   . Diabetes Father       Objective:  Constitutional: in no apparent distress and  alert,  Filed Vitals:   09/05/15 1115  BP: 127/81  Pulse: 106  Temp: 97.4 F (36.3 C)   Eyes: anicteric Cardiovascular: Cor RRR and No murmurs Respiratory: CTA B'; normal respiratoryeffort Gastrointestinal: Bowel sounds are normal, liver is not enlarged, spleen is not enlarged Musculoskeletal: no pedal edema noted Skin: negatives: no rash; no porphyria cutanea tarda Lymphatic: no cervical lymphadenopathy   Laboratory Genotype:  Lab Results  Component Value Date   HCVGENOTYPE 1a 06/14/2015   HCV viral load:  Lab Results  Component Value Date   HCVQUANT [id number]* 06/14/2015   Lab Results  Component Value Date   WBC 5.4 08/21/2015   HGB 13.6 08/21/2015   HCT 41.0 08/21/2015   MCV 89.9 08/21/2015   PLT 258 08/21/2015    Lab Results  Component Value Date   CREATININE 1.22* 06/05/2015   BUN 13 06/05/2015   NA 139 06/05/2015   K 4.2 06/05/2015   CL 105 06/05/2015   CO2 24 06/05/2015    Lab Results  Component Value Date   ALT 36* 08/21/2015   AST 44* 06/05/2015   ALKPHOS 99 06/05/2015     Labs and history reviewed and show CHILD-PUGH A  5-6 points: Child class A 7-9 points: Child class B 10-15 points: Child class C  Lab Results  Component Value Date   INR 1.0 08/21/2015   BILITOT 0.4 06/05/2015   ALBUMIN 3.9 06/05/2015     Assessment: New Patient with Chronic Hepatitis C genotype 1a, untreated.  I discussed with the patient the lab findings that confirm chronic hepatitis C as well as the natural history and progression of disease including about 30% of people who develop cirrhosis of the liver if left untreated and once cirrhosis is established there is a 2-7% risk per year of liver cancer and liver failure.  I discussed the importance of treatment and benefits in reducing the risk, even if significant liver fibrosis exists.   Plan: 1) Patient counseled extensively on limiting acetaminophen to no more than 2 grams daily, avoidance of alcohol. 2)  Transmission discussed with patient including sexual transmission, sharing razors and toothbrush.   3) Will need referral to gastroenterology if concern for cirrhosis 4) Will need referral for substance abuse counseling: No.; Further work up to include urine drug screen  No. 5) Will prescribe Harvoni for 12 weeks 6) Hepatitis A vaccine Yes.   7) Hepatitis B vaccine No. 8) Pneumovax vaccine if concern for cirrhosis 9) Further work up to include liver staging with elastography 10) will follow up after starting medication

## 2015-09-05 NOTE — Patient Instructions (Signed)
Date 09/05/2015  Dear Ms. Brase, As discussed in the ID Clinic, your hepatitis C therapy will include the following medications:          Harvoni 90mg /400mg  tablet:           Take 1 tablet by mouth once daily   Please note that ALL MEDICATIONS WILL START ON THE SAME DATE for a total of 12 weeks. ---------------------------------------------------------------- Your HCV Treatment Start Date: TBA   Your HCV genotype:  1a    Liver Fibrosis: TBD    ---------------------------------------------------------------- YOUR PHARMACY CONTACT:   [facility] [address] Phone: [phone] Hours: Monday to Friday 7:30 am to 6:00 pm   Please always contact your pharmacy at least 3-4 business days before you run out of medications to ensure your next month's medication is ready or 1 week prior to running out if you receive it by mail.  Remember, each prescription is for 28 days. ---------------------------------------------------------------- GENERAL NOTES REGARDING YOUR HEPATITIS C MEDICATION:  SOFOSBUVIR/LEDIPASVIR (HARVONI): - Harvoni tablet is taken daily with OR without food. - The tablets are orange. - The tablets should be stored at room temperature.  - Acid reducing agents such as H2 blockers (ie. Pepcid (famotidine), Zantac (ranitidine), Tagamet (cimetidine), Axid (nizatidine) and proton pump inhibitors (ie. Prilosec (omeprazole), Protonix (pantoprazole), Nexium (esomeprazole), or Aciphex (rabeprazole)) can decrease effectiveness of Harvoni. Do not take until you have discussed with a health care provider.    -Antacids that contain magnesium and/or aluminum hydroxide (ie. Milk of Magensia, Rolaids, Gaviscon, Maalox, Mylanta, an dArthritis Pain Formula)can reduce absorption of Harvoni, so take them at least 4 hours before or after Harvoni.  -Calcium carbonate (calcium supplements or antacids such as Tums, Caltrate,  Os-Cal)needs to be taken at least 4 hours hours before or after Harvoni.  -St. John's wort or any products that contain St. John's wort like some herbal supplements  Please inform the office prior to starting any of these medications.  - The common side effects associated with Harvoni include:      1. Fatigue      2. Headache      3. Nausea      4. Diarrhea      5. Insomnia  Please note that this only lists the most common side effects and is NOT a comprehensive list of the potential side effects of these medications. For more information, please review the drug information sheets that come with your medication package from the pharmacy.  ---------------------------------------------------------------- GENERAL HELPFUL HINTS ON HCV THERAPY: 1. Stay well-hydrated. 2. Notify the ID Clinic of any changes in your other over-the-counter/herbal or prescription medications. 3. If you miss a dose of your medication, take the missed dose as soon as you remember. Return to your regular time/dose schedule the next day.  4.  Do not stop taking your medications without first talking with your healthcare provider. 5.  You may take Tylenol (acetaminophen), as long as the dose is less than 2000 mg (OR no more than 4 tablets of the Tylenol Extra Strengths 500mg  tablet) in 24 hours. 6.  You will see our pharmacist-specialist within the first 2 weeks of starting your medication. 7.  You will need to obtain routine labs around week 4 and12 weeks after starting and then 3 to 6 months after finishing Harvoni.    Staci RighterOMER, Tyree Vandruff, MD  [facility] [address] [place],  [place]  [address] [phone]

## 2015-09-13 ENCOUNTER — Other Ambulatory Visit: Payer: Self-pay | Admitting: Internal Medicine

## 2015-09-13 DIAGNOSIS — M545 Low back pain, unspecified: Secondary | ICD-10-CM

## 2015-09-13 MED ORDER — OXYCODONE-ACETAMINOPHEN 5-325 MG PO TABS: 1.0000 | ORAL_TABLET | Freq: Four times a day (QID) | ORAL | Status: DC | PRN

## 2015-09-13 NOTE — Addendum Note (Signed)
Addended by: Willadean CarolMAYO, Parry Po D on: 09/13/2015 08:30 PM   Modules accepted: Orders

## 2015-09-13 NOTE — Telephone Encounter (Signed)
Will forward to MD to advise. Jazmin Hartsell,CMA  

## 2015-09-13 NOTE — Telephone Encounter (Signed)
Needs refill on percocet. Please call when ready for pickup  °

## 2015-09-13 NOTE — Telephone Encounter (Signed)
Medication refilled. Will not continue this medication long term. Would like to get Pt off narcotics once her Hepatitis C is treated and her RUQ pain improves.  Willadean CarolKaty Mayo, MD PGY-2

## 2015-09-13 NOTE — Telephone Encounter (Signed)
Please let Ms. Joubert know that I have refilled her prescription and left it at the front desk for her to pick up. Thank you!

## 2015-09-14 NOTE — Telephone Encounter (Signed)
Unable to reach patient by phone.  Please inform her that scripts are ready for pick up. Jazmin Hartsell,CMA

## 2015-09-14 NOTE — Telephone Encounter (Signed)
Tried calling patient no answer or VM available.  Will try again later today. Fardowsa Authier,CMA

## 2015-09-18 ENCOUNTER — Telehealth: Payer: Self-pay

## 2015-09-18 NOTE — Telephone Encounter (Signed)
Called twice to notify patient that elastography was approved by Medicaid and to give phone number to scheduling, [phone], so she can find a time suitable for her schedule. No voicemail to leave message. Rejeana Brockandace Murray, LPN

## 2015-10-15 ENCOUNTER — Other Ambulatory Visit: Payer: Self-pay | Admitting: *Deleted

## 2015-10-15 DIAGNOSIS — M545 Low back pain, unspecified: Secondary | ICD-10-CM

## 2015-10-15 DIAGNOSIS — F411 Generalized anxiety disorder: Secondary | ICD-10-CM

## 2015-10-15 MED ORDER — CETIRIZINE HCL 10 MG PO TABS: 10.0000 mg | ORAL_TABLET | Freq: Every day | ORAL | 2 refills | Status: AC | PRN

## 2015-10-15 NOTE — Telephone Encounter (Signed)
Pt would like a refill on her albuterol, she is out. Pt would also like refills on zyrtec and ledipasvir. Please advise. Thanks! ep

## 2015-10-15 NOTE — Telephone Encounter (Signed)
Covering inbox for Dr. Nancy MarusMayo  Patient requesting Hydroxyzine and Percocet refills. Per Epic review, unclear on need for continued Hydroxyzine and Percocet. Will not refill at this time as she will need to be seen in clinic for these specific requests.   Anders Simmondshristina Gambino, MD [facility], PGY-2

## 2015-10-16 NOTE — Telephone Encounter (Signed)
Called patient again to notify her of upcoming elastography appointment on August 16th. Voicemail full. Will attempt again.   LPN called [employer] and received an extension on prior-authorization until Aug 30th. New authorization number [id number]. Rejeana Brockandace Shatima Zalar, LPN

## 2015-10-16 NOTE — Telephone Encounter (Signed)
Informed patient of message from MD. Patient upset and states that she just got all her teeth pulled at the top and she hasnt had any problem getting her medicines filled in the past. Offered patient appointment but she is requesting that MD call her today regarding refills.

## 2015-10-17 ENCOUNTER — Telehealth: Payer: Self-pay | Admitting: Internal Medicine

## 2015-10-17 NOTE — Telephone Encounter (Signed)
Pt called again about pain medication refill. Pt is extremely angry. Pt stated she had all of her top teeth removed and that the dental surgeon  told her to get a Rx for pain from PCP. Pt is completely out of pain medication. Pt stated she waited for a phone call all day yesterday from Dr. Jonathon JordanGambino, but that she never called. Pt wants to speak with a doctor and wants a Rx for pain today. Please advise. Thanks! ep

## 2015-10-17 NOTE — Telephone Encounter (Signed)
Will forward to MD. Jazmin Hartsell,CMA  

## 2015-10-17 NOTE — Telephone Encounter (Signed)
Spoke with patient about upcoming elastography appointment. Patient stated she couldn't make that appointment due to her having to use Medicaid transportation and them not running that early. Patient needed an appointment 9am and after. Called and cancelled 8/16 appointment, first available fitting criteria 09/20 at 10am. Patient aware and knows to not eat anything after midnight and to arrive at 9:45am. Prior-authorization will be cancelled and re-done in September. Rejeana Brockandace Shizuko Wojdyla, LPN

## 2015-10-18 ENCOUNTER — Other Ambulatory Visit: Payer: Self-pay | Admitting: Internal Medicine

## 2015-10-18 DIAGNOSIS — M545 Low back pain, unspecified: Secondary | ICD-10-CM

## 2015-10-18 MED ORDER — OXYCODONE-ACETAMINOPHEN 5-325 MG PO TABS: 1.0000 | ORAL_TABLET | Freq: Four times a day (QID) | ORAL | 0 refills | Status: DC | PRN

## 2015-10-18 NOTE — Telephone Encounter (Signed)
Attempted to call patient back about her pain medications. It went to voicemail both times and the voicemail box was full, so I was unable to leave a message. I am currently at the [facility] in [place] and do not have access to EPIC except on my phone, but will attempt to get back to clinic before 5 to sign her pain medication prescription. I would like to speak with her on the phone before I write the prescription. I will try calling her  again this afternoon. Thanks!

## 2015-10-18 NOTE — Telephone Encounter (Signed)
Pt called again about pain medicine. Pt states she has been calling for a week several times a day and no response. Pt states she is in a lot of pain and is unable to rest. Please advise. Thanks! ep

## 2015-10-24 ENCOUNTER — Ambulatory Visit (HOSPITAL_COMMUNITY): Payer: Medicaid Other

## 2015-11-13 ENCOUNTER — Other Ambulatory Visit: Payer: Self-pay | Admitting: Internal Medicine

## 2015-11-13 DIAGNOSIS — M545 Low back pain, unspecified: Secondary | ICD-10-CM

## 2015-11-13 MED ORDER — OXYCODONE-ACETAMINOPHEN 5-325 MG PO TABS: 1.0000 | ORAL_TABLET | Freq: Four times a day (QID) | ORAL | 0 refills | Status: DC | PRN

## 2015-11-13 NOTE — Telephone Encounter (Signed)
Tried calling pt to inform her of rx ready for pick up. No answer and no option for VM.

## 2015-11-13 NOTE — Telephone Encounter (Signed)
Please let Ashley Jordan know that I have refilled her medication and she can pick it up at our front desk. She is also overdue for a physical exam and pap smear, so please schedule an appointment for her in the next few months. Thank you!

## 2015-11-13 NOTE — Telephone Encounter (Signed)
Pt called and would like her Percocet prescription left up front for pick up. Please call when ready. jw

## 2015-11-23 ENCOUNTER — Telehealth: Payer: Self-pay

## 2015-11-23 NOTE — Telephone Encounter (Signed)
Prior authorization approved for elastography for dates 09/15-10/15:  [id number]. Patient is aware of appointment. Rejeana Brockandace Murray, LPN

## 2015-11-26 ENCOUNTER — Telehealth: Payer: Self-pay | Admitting: *Deleted

## 2015-11-26 ENCOUNTER — Encounter: Payer: Self-pay | Admitting: Internal Medicine

## 2015-11-26 NOTE — Telephone Encounter (Signed)
Patient needs a note for social services stating that due to her allergies/asthma she is unable to be without her power.  She needs this for assistance with her power bill.  She is due for disconnection on Wednesday and would like to have this emailed to her ASAP so she can go into the office tomorrow with it. Please call her with any questions.  [email or web address]  Jazmin Hartsell,CMA

## 2015-11-27 NOTE — Telephone Encounter (Signed)
Provider emailed note to patient. Jazmin Hartsell,CMA

## 2015-11-28 ENCOUNTER — Ambulatory Visit (HOSPITAL_COMMUNITY)
Admission: RE | Admit: 2015-11-28 | Discharge: 2015-11-28 | Disposition: A | Discharge status: Home / Self Care | Payer: Medicaid Other | Source: Ambulatory Visit | Attending: Internal Medicine | Admitting: Internal Medicine

## 2015-11-28 DIAGNOSIS — B182 Chronic viral hepatitis C: Secondary | ICD-10-CM | POA: Insufficient documentation

## 2015-12-14 ENCOUNTER — Telehealth: Payer: Self-pay | Admitting: Internal Medicine

## 2015-12-14 DIAGNOSIS — M545 Low back pain: Principal | ICD-10-CM

## 2015-12-14 DIAGNOSIS — G8929 Other chronic pain: Secondary | ICD-10-CM

## 2015-12-14 MED ORDER — OXYCODONE-ACETAMINOPHEN 5-325 MG PO TABS: 1.0000 | ORAL_TABLET | Freq: Four times a day (QID) | ORAL | 0 refills | Status: DC | PRN

## 2015-12-14 NOTE — Telephone Encounter (Signed)
Pt states she emailed Dr. Nancy MarusMayo a couple of days ago, asking for a refill on percocet. Pt is out as of today and would like to pick up Rx today. Please advise. Thanks!ep

## 2015-12-14 NOTE — Telephone Encounter (Signed)
Pt called about her percocet. She is completely out and wants to get the prescription by the end of the day today

## 2015-12-14 NOTE — Telephone Encounter (Signed)
Will forward to MD to advise. Luiz Trumpower,CMA  

## 2015-12-24 ENCOUNTER — Encounter: Payer: Self-pay | Admitting: Internal Medicine

## 2015-12-26 ENCOUNTER — Encounter: Payer: Medicaid Other | Admitting: Internal Medicine

## 2015-12-31 ENCOUNTER — Encounter: Payer: Self-pay | Admitting: Internal Medicine

## 2015-12-31 ENCOUNTER — Telehealth: Payer: Self-pay | Admitting: *Deleted

## 2015-12-31 ENCOUNTER — Other Ambulatory Visit: Payer: Self-pay | Admitting: Internal Medicine

## 2015-12-31 ENCOUNTER — Other Ambulatory Visit (HOSPITAL_COMMUNITY)
Admission: RE | Admit: 2015-12-31 | Discharge: 2015-12-31 | Disposition: A | Discharge status: Home / Self Care | Payer: Medicaid Other | Source: Ambulatory Visit | Attending: Family Medicine | Admitting: Family Medicine

## 2015-12-31 ENCOUNTER — Ambulatory Visit (INDEPENDENT_AMBULATORY_CARE_PROVIDER_SITE_OTHER): Payer: Medicaid Other | Admitting: Internal Medicine

## 2015-12-31 VITALS — BP 115/78 | HR 93 | Temp 98.6°F | Ht 64.0 in | Wt 165.2 lb

## 2015-12-31 DIAGNOSIS — Z01411 Encounter for gynecological examination (general) (routine) with abnormal findings: Secondary | ICD-10-CM | POA: Diagnosis present

## 2015-12-31 DIAGNOSIS — Z1151 Encounter for screening for human papillomavirus (HPV): Secondary | ICD-10-CM | POA: Diagnosis not present

## 2015-12-31 DIAGNOSIS — Z111 Encounter for screening for respiratory tuberculosis: Secondary | ICD-10-CM

## 2015-12-31 DIAGNOSIS — N949 Unspecified condition associated with female genital organs and menstrual cycle: Secondary | ICD-10-CM

## 2015-12-31 DIAGNOSIS — Z113 Encounter for screening for infections with a predominantly sexual mode of transmission: Secondary | ICD-10-CM | POA: Diagnosis not present

## 2015-12-31 DIAGNOSIS — Z0000 Encounter for general adult medical examination without abnormal findings: Secondary | ICD-10-CM

## 2015-12-31 DIAGNOSIS — Z124 Encounter for screening for malignant neoplasm of cervix: Secondary | ICD-10-CM | POA: Diagnosis not present

## 2015-12-31 DIAGNOSIS — Z01419 Encounter for gynecological examination (general) (routine) without abnormal findings: Secondary | ICD-10-CM

## 2015-12-31 LAB — POCT WET PREP (WET MOUNT)
Clue Cells Wet Prep Whiff POC: POSITIVE
Trichomonas Wet Prep HPF POC: ABSENT

## 2015-12-31 MED ORDER — METRONIDAZOLE 500 MG PO TABS: 500.0000 mg | ORAL_TABLET | Freq: Two times a day (BID) | ORAL | 0 refills | Status: DC

## 2015-12-31 MED ORDER — FLUCONAZOLE 150 MG PO TABS: 150.0000 mg | ORAL_TABLET | Freq: Once | ORAL | 0 refills | Status: AC

## 2015-12-31 NOTE — Patient Instructions (Addendum)
It was so nice to see you!  Please call the breast center to have your mammogram done.   I will call you with the results of your labs and your pap smear.  Please come back to see us in the next 1-3 months to talk about your other medical conditions such as your asthma and chronic pain. We will need to have a discussion about pain medications in the near future.  -Dr. Nancy MarusMayo

## 2015-12-31 NOTE — Assessment & Plan Note (Signed)
Pt needs TB testing for work - PPD placed - Pt will come back on 10/26 for nursing visit to have the PPD read

## 2015-12-31 NOTE — Telephone Encounter (Signed)
Patient asking about her treatment status. Please advise. Andree CossHowell, Jazelyn Sipe M, RN

## 2015-12-31 NOTE — Assessment & Plan Note (Signed)
Pt has been having vaginal discomfort for the last 3 weeks. She thought she had a tampon stuck up in her vagina, but she has not been able to get anything out. On exam, she has a moderate amount of thick white discharge. Exam is otherwise remarkable. No foreign bodies present in the vagina. - Wet prep performed and was significant for clue cells - Flagyl 500mg  bid x 7 days - Follow-up if not improving.

## 2015-12-31 NOTE — Assessment & Plan Note (Signed)
Pt requesting routine STD testing. She is sexually active with one partner. - GC/Chlamydia, HIV, RPR ordered - Pt provided with condoms today. She states she uses them consistently.

## 2015-12-31 NOTE — Progress Notes (Signed)
45 y.o. year old female presents for well woman/preventative visit and annual GYN examination.  Acute Concerns:  - Needs a TB test for work.  - She feels like there's a tampon stuck in her vagina for the last couple of weeks. She has tried to get it out, but nothing has come out. She is having vaginal discomfort. No dysuria.  Diet: Eats a lot of salad. Drinks ginger ale and a lot of water.  Exercise: Doing stretches and walking.  Sexual/Birth History: Sexually active with one partner.  Birth Control: She uses condoms consistently.  Social:  Social History   Social History  . Marital status: Legally Separated    Spouse name: N/A  . Number of children: N/A  . Years of education: N/A   Social History Main Topics  . Smoking status: Current Some Day Smoker    Packs/day: 0.30    Types: Cigarettes  . Smokeless tobacco: Current User    Types: Chew     Comment: occasional smoker  . Alcohol use 0.0 oz/week     Comment: social  . Drug use: No  . Sexual activity: Yes   Other Topics Concern  . None   Social History Narrative   Lives in [place] with her daughter Clement Sayres(Zoe Muns).    Smoked socially, but has quit.     Immunization: Immunization History  Administered Date(s) Administered  . Influenza Whole 12/12/2008  . PPD Test 06/19/2014, 08/09/2014  . Td 03/10/1998, 05/31/2008    Cancer Screening:  Pap Smear: Performed today  Mammogram: She had a mammogram last year. She is due for another one this year and will call to have this scheduled.  Physical Exam: VITALS: Reviewed GEN: Pleasant female, NAD HEENT: Normocephalic, PERRL, EOMI, no scleral icterus, nasal septum midline, MMM CARDIAC:RRR, S1 and S2 present, no murmur, no heaves/thrills RESP: CTAB, normal effort BREAST:Exam performed in the presence of a chaperone. No masses. No nipple discharge. No axillary lymphadenopathy. ABD: soft, normal bowel sounds, mild tenderness to palpation of the right side directly underneath her  ribs GU/GYN:Exam performed in the presence of a chaperone. Normal external genitalia. Cervix unremarkable. Moderate amount of thick white discharge present. No cervical motion tenderness. EXT: No edema, 2+ radial and DP pulses SKIN: no rash  ASSESSMENT & PLAN: 45 y.o. female presents for annual well woman/preventative exam and GYN exam. Please see problem specific assessment and plan.   Vaginal Discomfort: Pt has been having vaginal discomfort for the last 3 weeks. She thought she had a tampon stuck up in her vagina, but she has not been able to get anything out. On exam, she has a moderate amount of thick white discharge. Exam is otherwise remarkable. No foreign bodies present in the vagina. - Wet prep performed and was significant for clue cells - Flagyl 500mg  bid x 7 days - Follow-up if not improving.  Routine STD Testing: Pt requesting routine STD testing. She is sexually active with one partner. - GC/Chlamydia, HIV, RPR ordered - Pt provided with condoms today. She states she uses them consistently.  TB Screening: Pt needs TB testing for work - PPD placed - Pt will come back on 10/26 for nursing visit to have the PPD read  Health Care Maintenance: Pt has been getting mammograms every year since age 45. She is also overdue for a Pap. - She will call the breast center to schedule an appointment for this year. - Pap performed today. Will call patient regarding results.   Willadean CarolKaty Gibson Lad, MD  PGY-2

## 2015-12-31 NOTE — Assessment & Plan Note (Signed)
Pt has been getting mammograms every year since age 45. She is also overdue for a Pap. - She will call the breast center to schedule an appointment for this year. - Pap performed today. Will call patient regarding results.

## 2016-01-01 LAB — HIV ANTIBODY (ROUTINE TESTING W REFLEX): HIV 1&2 Ab, 4th Generation: NONREACTIVE

## 2016-01-01 LAB — RPR

## 2016-01-01 NOTE — Telephone Encounter (Signed)
I spoke with Ashley Jordan this morning.  Let her know that Medicaid may loosen the guidelines in November and when they do, I will resubmit to, hopefully, get her hep C medication approved this time.  Her elastography is F0 F1 and medicaid will not approve at this time.

## 2016-01-02 LAB — CERVICOVAGINAL ANCILLARY ONLY
CHLAMYDIA, DNA PROBE: NEGATIVE
NEISSERIA GONORRHEA: NEGATIVE
TRICH (WINDOWPATH): NEGATIVE

## 2016-01-03 ENCOUNTER — Ambulatory Visit: Payer: Medicaid Other | Admitting: *Deleted

## 2016-01-03 DIAGNOSIS — Z111 Encounter for screening for respiratory tuberculosis: Secondary | ICD-10-CM

## 2016-01-03 LAB — CERVICOVAGINAL ANCILLARY ONLY: Candida vaginitis: NEGATIVE

## 2016-01-03 LAB — TB SKIN TEST
Induration: 0 mm
TB Skin Test: NEGATIVE

## 2016-01-03 NOTE — Progress Notes (Signed)
Pt is here for PPD read today was paced on 12/31/15.  Upon reviewing form, I noticed that it indicates that she will need a 2 step ppd.  Pt informed and instructed on process, she will return next week for 2nd step and to have form completed.  Fleeger, Maryjo RochesterJessica Dawn, CMA

## 2016-01-04 NOTE — Telephone Encounter (Signed)
Called patient on the phone to let her know that all of her routine STD testing came back negative. She voiced understanding. She will call back with any questions.

## 2016-01-07 ENCOUNTER — Encounter: Payer: Self-pay | Admitting: Internal Medicine

## 2016-01-09 LAB — CYTOLOGY - PAP
DIAGNOSIS: UNDETERMINED — AB
HPV (WINDOPATH): NOT DETECTED

## 2016-01-11 ENCOUNTER — Other Ambulatory Visit: Payer: Self-pay | Admitting: Internal Medicine

## 2016-01-11 DIAGNOSIS — G8929 Other chronic pain: Secondary | ICD-10-CM

## 2016-01-11 DIAGNOSIS — M545 Low back pain, unspecified: Secondary | ICD-10-CM

## 2016-01-11 MED ORDER — OXYCODONE-ACETAMINOPHEN 5-325 MG PO TABS: 1.0000 | ORAL_TABLET | Freq: Four times a day (QID) | ORAL | 0 refills | Status: DC | PRN

## 2016-01-11 NOTE — Telephone Encounter (Signed)
Please let Ashley Jordan know that I have refilled her medication and placed the prescription up from for her to pick up. Thank you!

## 2016-01-11 NOTE — Telephone Encounter (Signed)
Unable to reach pt to inform her. If she calls back please let her know her rx is up front!

## 2016-01-11 NOTE — Telephone Encounter (Signed)
Pt is calling and would like to have a refill on her Percocet left up front. jw

## 2016-01-11 NOTE — Progress Notes (Signed)
Medication refilled

## 2016-01-14 ENCOUNTER — Ambulatory Visit: Payer: Medicaid Other | Admitting: Pharmacist

## 2016-01-14 ENCOUNTER — Ambulatory Visit (INDEPENDENT_AMBULATORY_CARE_PROVIDER_SITE_OTHER): Payer: Medicaid Other | Admitting: *Deleted

## 2016-01-14 DIAGNOSIS — Z111 Encounter for screening for respiratory tuberculosis: Secondary | ICD-10-CM

## 2016-01-14 DIAGNOSIS — Z23 Encounter for immunization: Secondary | ICD-10-CM | POA: Diagnosis not present

## 2016-01-14 DIAGNOSIS — B182 Chronic viral hepatitis C: Secondary | ICD-10-CM

## 2016-01-14 MED ORDER — GLECAPREVIR-PIBRENTASVIR 100-40 MG PO TABS: 3.0000 | ORAL_TABLET | Freq: Every day | ORAL | 1 refills | Status: AC

## 2016-01-14 NOTE — Progress Notes (Signed)
   PPD placed Left Forearm.  Pt to return 01/16/2016 for reading.  Pt tolerated intradermal injection. Clovis PuMartin, Anely Spiewak L, RN

## 2016-01-14 NOTE — Progress Notes (Signed)
Pt came in for a new Hep C viral load.  Last one in April. She is F0 and Medicaid - now since Nov 1, Medicaid approving patient's for M[place] with F0. Had her sign Medicaid readiness form as well. We will keep in touch with her when her medication is approved.

## 2016-01-14 NOTE — Addendum Note (Signed)
Addended by: Robinette HainesKUPPELWEISER, Brunella Wileman L on: 01/14/2016 02:48 PM   Modules accepted: Orders

## 2016-01-16 LAB — HEPATITIS C RNA QUANTITATIVE
HCV Quantitative Log: 6.76 {Log} — ABNORMAL HIGH (ref <1.18)
HCV Quantitative: 5695470 IU/mL — ABNORMAL HIGH (ref <15)

## 2016-01-28 MED FILL — MAVYRET 100-40 MG TABS: 100-40 | 28 days supply | Qty: 84 | Fill #0

## 2016-02-06 ENCOUNTER — Encounter: Payer: Self-pay | Admitting: Pharmacy Technician

## 2016-02-11 ENCOUNTER — Other Ambulatory Visit: Payer: Self-pay | Admitting: Internal Medicine

## 2016-02-11 DIAGNOSIS — M545 Low back pain, unspecified: Secondary | ICD-10-CM

## 2016-02-11 DIAGNOSIS — G8929 Other chronic pain: Secondary | ICD-10-CM

## 2016-02-11 MED ORDER — OXYCODONE-ACETAMINOPHEN 5-325 MG PO TABS: 1.0000 | ORAL_TABLET | Freq: Four times a day (QID) | ORAL | 0 refills | Status: DC | PRN

## 2016-02-11 NOTE — Telephone Encounter (Signed)
Needs refill on percocet listeri.  Will call back today to see if they had been filled. Pt has an appt 02-18-16 at 4pm

## 2016-02-11 NOTE — Telephone Encounter (Signed)
Please let Ms. Lyssy know that I have refilled her Percocet. Thanks!

## 2016-02-18 ENCOUNTER — Ambulatory Visit: Payer: Medicaid Other | Admitting: Internal Medicine

## 2016-02-21 ENCOUNTER — Ambulatory Visit: Payer: Medicaid Other

## 2016-02-27 ENCOUNTER — Ambulatory Visit: Payer: Medicaid Other

## 2016-02-29 ENCOUNTER — Ambulatory Visit: Payer: Medicaid Other | Admitting: Internal Medicine

## 2016-03-17 ENCOUNTER — Other Ambulatory Visit: Payer: Self-pay | Admitting: Internal Medicine

## 2016-03-17 DIAGNOSIS — M545 Low back pain: Principal | ICD-10-CM

## 2016-03-17 DIAGNOSIS — G8929 Other chronic pain: Secondary | ICD-10-CM

## 2016-03-17 NOTE — Telephone Encounter (Signed)
Pt called because she needs a refill on her Percocet. She has been out of town with her mother who had to have emergency surgery. She has also set up an appointment for 03/24/16 to see the doctor. jw

## 2016-03-18 ENCOUNTER — Other Ambulatory Visit: Payer: Self-pay | Admitting: Internal Medicine

## 2016-03-18 DIAGNOSIS — M545 Low back pain: Principal | ICD-10-CM

## 2016-03-18 DIAGNOSIS — G8929 Other chronic pain: Secondary | ICD-10-CM

## 2016-03-18 MED ORDER — OXYCODONE-ACETAMINOPHEN 5-325 MG PO TABS: 1.0000 | ORAL_TABLET | Freq: Four times a day (QID) | ORAL | 0 refills | Status: DC | PRN

## 2016-03-18 NOTE — Telephone Encounter (Signed)
Pt will be seeing me in 1 week. She normally gets 30 pills of Percocet to last her 1 month. I will give her 10 pills to last until our appointment. If she misses the appointment, she will not be able to get refills until she is seen by me. Thank you!

## 2016-03-18 NOTE — Telephone Encounter (Signed)
LM for patient to call back.  Please inform her that her script is ready at the desk and that she will need to keep her upcoming appointment in order to get a full month's supply. Gavan Nordby,CMA

## 2016-03-18 NOTE — Progress Notes (Signed)
Pt called requesting a refill of her Percocet. She has missed multiple appointments with me to discuss her chronic pain. She has an appointment scheduled with me in 1 week. She normally receives Percocet #30 each month. I will give her 10 pills to last until our appointment. If she does not make the appointment, she will not be able to get another refill until she is seen by me.

## 2016-03-21 MED FILL — OXYCODONE W/APAP 5/325 TAB: 5-325 | 2 days supply | Qty: 10 | Fill #0

## 2016-03-24 ENCOUNTER — Ambulatory Visit (INDEPENDENT_AMBULATORY_CARE_PROVIDER_SITE_OTHER): Payer: Medicaid Other | Admitting: Internal Medicine

## 2016-03-24 ENCOUNTER — Encounter: Payer: Self-pay | Admitting: Internal Medicine

## 2016-03-24 DIAGNOSIS — F4310 Post-traumatic stress disorder, unspecified: Secondary | ICD-10-CM | POA: Diagnosis not present

## 2016-03-24 DIAGNOSIS — M545 Low back pain, unspecified: Secondary | ICD-10-CM

## 2016-03-24 DIAGNOSIS — G8929 Other chronic pain: Secondary | ICD-10-CM

## 2016-03-24 DIAGNOSIS — J019 Acute sinusitis, unspecified: Secondary | ICD-10-CM | POA: Diagnosis not present

## 2016-03-24 DIAGNOSIS — B9689 Other specified bacterial agents as the cause of diseases classified elsewhere: Secondary | ICD-10-CM

## 2016-03-24 MED ORDER — OXYCODONE-ACETAMINOPHEN 5-325 MG PO TABS: 1.0000 | ORAL_TABLET | Freq: Four times a day (QID) | ORAL | 0 refills | Status: DC | PRN

## 2016-03-24 MED ORDER — AMOXICILLIN-POT CLAVULANATE 875-125 MG PO TABS: 1.0000 | ORAL_TABLET | Freq: Two times a day (BID) | ORAL | 0 refills | Status: AC

## 2016-03-24 MED ORDER — AMOXICILLIN-POT CLAVULANATE 875-125 MG PO TABS: 1.0000 | ORAL_TABLET | Freq: Two times a day (BID) | ORAL | 0 refills | Status: DC

## 2016-03-24 MED ORDER — OXYCODONE-ACETAMINOPHEN 5-325 MG PO TABS: 1.0000 | ORAL_TABLET | Freq: Four times a day (QID) | ORAL | 0 refills | Status: AC | PRN

## 2016-03-24 NOTE — Progress Notes (Signed)
   [facility] Phone: [phone]  Subjective:  Ashley Jordan is a 46 year old female presenting to clinic for follow-up of her chronic pain, PTSD, and concerns for a sinus infection.  Chronic Pain: Thinks this is from her Hepatitis C, as it is located in her lower back R>L. Has been constant pain for years. Was started on Percocet by previous PCP. Mostly taking 1 tablet at night when she has pain because it makes her drowsy and she needs to take care of her daughter. Tolerating Percocet without any issues. No constipation.  PTSD: Has been going on for years. States she always jumps whenever she hears loud noises. She feels anxious all the time. She has a lot of worry. 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 No SI, no HI.  Sinus Congestion: Has had sinus congestion for the last 2 weeks. She has a lot of thick green nasal discharge. She has also started noticing some blood streaks. She has had low grade fevers to 99.2. She also endorses chills. She has been using her home Flonase and Zyrtec, which haven't been helping. No nausea, no vomiting, no shortness of breath, no cough. She endorses facial pain.   ROS: See HPI for pertinent positives and negatives  Past Medical History- chronic pain, chronic Hepatitis C, asthma  Family history reviewed for today's visit. No changes.  Social history- patient is a current smoker  Objective: BP 110/62 (BP Location: Left Arm, Patient Position: Sitting, Cuff Size: Normal)   Pulse 94   Temp 98 F (36.7 C) (Oral)   Ht 5\' 4"  (1.626 m)   Wt 168 lb 3.2 oz (76.3 kg)   LMP 02/25/2016 (Within Weeks)   SpO2 99%   BMI 28.87 kg/m  Gen: NAD, alert, cooperative with exam HEENT: NCAT, EOMI, TMs clear, nasal turbinates are erythematous and edematous, oropharynx erythematous, sinuses are tender to palpation Neck: FROM, supple, no cervical lymphadenopathy CV: RRR, no murmur Resp: CTABL, no wheezes, normal work of  breathing  Assessment/Plan: Chronic Pain: Pt notes chronic pain in multiple sites- bilateral lower back, right flank, teeth, menstrual cramps. Started on Percocet by previous PCP. - Had a long discussion regarding chronic opioid use. Pt agreeable to tapering off Percocet. Currently has a prescription for #40 per month - Given refills for the following taper- #30 for 1 month, #20 for 1 month, #10 for 1 month, then no more refills will be provided - Pt will need to follow-up for a chronic pain visit. Can consider alternative medications (Cymbalta, Amitriptyline, Gabapentin) at that time.  PTSD: Pt is concerned that she has PTSD. She also notes significant anxiety at time. No SI, no HI. Pt would like referral to psychology. - Pt given list for psychologists in [place] who accept Medicaid - Pt will call to schedule an appointment  Sinus Infection: Pt with copious green nasal discharge, low grade fevers, chills, and facial pain for the last 2 weeks. Has been using Zyrtec and Flonase without relief. - Will treat for acute bacterial sinusitis with Augmentin x 5 days. - Follow-up if not improving   Willadean CarolKaty Mayo, MD PGY-2

## 2016-03-24 NOTE — Assessment & Plan Note (Signed)
Pt with copious green nasal discharge, low grade fevers, chills, and facial pain for the last 2 weeks. Has been using Zyrtec and Flonase without relief. - Will treat for acute bacterial sinusitis with Augmentin x 5 days. - Follow-up if not improving

## 2016-03-24 NOTE — Assessment & Plan Note (Signed)
Pt notes chronic pain in multiple sites- bilateral lower back, right flank, teeth, menstrual cramps. Started on Percocet by previous PCP. - Had a long discussion regarding chronic opioid use. Pt agreeable to tapering off Percocet. Currently has a prescription for #40 per month - Given refills for the following taper- #30 for 1 month, #20 for 1 month, #10 for 1 month, then no more refills will be provided - Pt will need to follow-up for a chronic pain visit. Can consider alternative medications (Cymbalta, Amitriptyline, Gabapentin) at that time.

## 2016-03-24 NOTE — Assessment & Plan Note (Signed)
Pt is concerned that she has PTSD. She also notes significant anxiety at time. No SI, no HI. Pt would like referral to psychology. - Pt given list for psychologists in [place] who accept Medicaid - Pt will call to schedule an appointment

## 2016-03-24 NOTE — Patient Instructions (Addendum)
It was so nice to see you!  For your percocet- we are starting a slow wean. I have prescribed 30 tablets for this month, 20 tablets for the next month, and 10 tablets for the last month. After that, we will need to discuss other ways to help with your back pain and period pain.  I have prescribed Augmentin for your sinus infection. Please take 1 tablet twice a day for 5 days. You can also buy some Afrin over the counter to help with the congestion. You should only use this for 3 days.  We will see you back in 6 months!  -Dr. Nancy MarusMayo

## 2016-04-11 ENCOUNTER — Ambulatory Visit: Payer: Medicaid Other | Admitting: Internal Medicine

## 2016-04-29 ENCOUNTER — Other Ambulatory Visit: Payer: Self-pay | Admitting: Internal Medicine

## 2016-04-29 DIAGNOSIS — F411 Generalized anxiety disorder: Secondary | ICD-10-CM

## 2016-07-28 ENCOUNTER — Other Ambulatory Visit: Payer: Self-pay | Admitting: Internal Medicine

## 2016-07-28 DIAGNOSIS — F411 Generalized anxiety disorder: Secondary | ICD-10-CM

## 2016-07-31 ENCOUNTER — Other Ambulatory Visit: Payer: Self-pay | Admitting: Internal Medicine

## 2016-10-08 NOTE — Progress Notes (Deleted)
   [facility] Phone: [phone]   Date of Visit: 10/09/2016   HPI:  ***  ROS: See HPI.  PMFSH:  PMH: Chronic Hep C  Genital Herpes Tobacco Use PTSD Asthma, Persistent moderate  PHYSICAL EXAM: There were no vitals taken for this visit. Gen: *** HEENT: *** Heart: *** Lungs: *** Neuro: *** Ext: ***  ASSESSMENT/PLAN:  Health maintenance:  -***  No problem-specific Assessment & Plan notes found for this encounter.  FOLLOW UP: Follow up in *** for ***  Palma HolterKanishka G Neima Lacross, MD PGY 2 [facility]

## 2016-10-09 ENCOUNTER — Ambulatory Visit: Payer: Medicaid Other | Admitting: Internal Medicine

## 2016-10-10 ENCOUNTER — Other Ambulatory Visit (HOSPITAL_COMMUNITY)
Admission: RE | Admit: 2016-10-10 | Discharge: 2016-10-10 | Disposition: A | Discharge status: Home / Self Care | Payer: Medicaid Other | Source: Ambulatory Visit | Attending: Family Medicine | Admitting: Family Medicine

## 2016-10-10 ENCOUNTER — Ambulatory Visit (INDEPENDENT_AMBULATORY_CARE_PROVIDER_SITE_OTHER): Payer: Medicaid Other | Admitting: Family Medicine

## 2016-10-10 ENCOUNTER — Telehealth: Payer: Self-pay | Admitting: *Deleted

## 2016-10-10 ENCOUNTER — Encounter: Payer: Self-pay | Admitting: Family Medicine

## 2016-10-10 VITALS — BP 120/88 | HR 86 | Temp 98.0°F | Ht 64.0 in | Wt 162.0 lb

## 2016-10-10 DIAGNOSIS — M545 Low back pain, unspecified: Secondary | ICD-10-CM

## 2016-10-10 DIAGNOSIS — G8929 Other chronic pain: Secondary | ICD-10-CM

## 2016-10-10 DIAGNOSIS — F411 Generalized anxiety disorder: Secondary | ICD-10-CM | POA: Diagnosis not present

## 2016-10-10 DIAGNOSIS — R102 Pelvic and perineal pain: Secondary | ICD-10-CM | POA: Diagnosis not present

## 2016-10-10 DIAGNOSIS — N912 Amenorrhea, unspecified: Secondary | ICD-10-CM | POA: Diagnosis present

## 2016-10-10 LAB — POCT WET PREP (WET MOUNT)
Clue Cells Wet Prep Whiff POC: POSITIVE
Trichomonas Wet Prep HPF POC: ABSENT

## 2016-10-10 LAB — POCT URINE PREGNANCY: Preg Test, Ur: NEGATIVE

## 2016-10-10 MED ORDER — ALBUTEROL SULFATE HFA 108 (90 BASE) MCG/ACT IN AERS: 2.0000 | INHALATION_SPRAY | RESPIRATORY_TRACT | 3 refills | Status: AC | PRN

## 2016-10-10 MED ORDER — HYDROXYZINE PAMOATE 50 MG PO CAPS: 50.0000 mg | ORAL_CAPSULE | Freq: Four times a day (QID) | ORAL | 0 refills | Status: DC | PRN

## 2016-10-10 MED ORDER — METRONIDAZOLE 500 MG PO TABS: 500.0000 mg | ORAL_TABLET | Freq: Two times a day (BID) | ORAL | 0 refills | Status: AC

## 2016-10-10 NOTE — Telephone Encounter (Signed)
lmovm for pt to return call. Ashley Jordan Dawn, CMA  

## 2016-10-10 NOTE — Telephone Encounter (Signed)
-----   Message from Tillman SersAngela C Riccio, DO sent at 10/10/2016  2:02 PM EDT ----- Please let [name] know her wet prep showed BV, I sent Flagyl in to her pharmacy. We will see her Monday.

## 2016-10-10 NOTE — Assessment & Plan Note (Addendum)
  Per patient no period for last 6 months, unclear etiology. Upreg negative.  -follow up w/ PCP Monday -consider pelvic US and other work up at that time

## 2016-10-10 NOTE — Progress Notes (Signed)
    Subjective:    Patient ID: Ashley Jordan, female    DOB: 1970/04/21, 46 y.o.   MRN: [id number]  CC: abdominal pain, missed period  Abdominal pain -works at [employer] and has bilateral low pelvic pain and low back pain -feels she may have hurt herself lifting something at work -back pain is chronic, groin pain has been going on for past week  -missed work past week due to this pain -has also had 3 days of watery diarrhea and subjective fever for past 2 days -denies vomiting, eating normally, endorses some nausea after eating -no blood in stool  Missed period -no period x 6 months -thinks she is going through menopause, mother went through menopause in her 6350's -she is sexually active with one partner, does not use condoms -denies vaginal discharge -reports last "totally normal period was 1 year ago". After that became irregular.   Smoking status reviewed- current every day smoker  Review of Systems- see HPI   Objective:  BP 120/88   Pulse 86   Temp 98 F (36.7 C) (Oral)   Ht 5\' 4"  (1.626 m)   Wt 162 lb (73.5 kg)   SpO2 98%   BMI 27.81 kg/m  Vitals and nursing note reviewed  General: well appearing, well nourished, in no acute distress Cardiac: RRR, clear S1 and S2, no murmurs, rubs, or gallops Respiratory: clear to auscultation bilaterally, no increased work of breathing Abdomen: soft, nondistended, no masses or organomegaly. Bowel sounds present. Mild tenderness in RLQ and LLQ, no guarding or rebound tenderness.  GU: normal female external genitalia without lesions, minimal amount of white discharge noted, cervix appears normal, some CMT tenderness L>R, no masses appreciated Neuro: alert and oriented, no focal deficits   Assessment & Plan:    Amenorrhea  Per patient no period for last 6 months, unclear etiology. Upreg negative.  -follow up w/ PCP Monday -consider pelvic US and other work up at that time  Pelvic pain  Unclear etiology, possibly MSK  related to job lifting heavy items. No hernia appreciated on exam bilaterally. Pelvic exam with some CMT, no ovarian masses appreciated. Some scant white discharge noted. Patient also endorsed some symptoms of viral gastroenteritis earlier this week.  -wet pep done today will follow up results w/ patient over the phone -GC/chlamydia sent, will follow up results -patient to follow up with PCP Monday for further work up -advised tylenol, heating pad for pain relief -immodium/pepto bismal recommended for diarrhea or upset stomach -work note given -return precautions given   Follow up with PCP on Monday August 6 at 8:50 am.  Dolores PattyAngela Nicholos Aloisi, DO Family Medicine Resident PGY-2

## 2016-10-10 NOTE — Telephone Encounter (Signed)
Pt contacted and informed of BV and antibiotics to pharmacy. Directions given for use and pt informed to call back if any questions.

## 2016-10-10 NOTE — Patient Instructions (Signed)
I'm sorry you're not feeling well! Please take tylenol or ibuprofen as needed for your abdominal pain. A heating pad can be very useful for abdominal pain. You can also use over the counter immodium for diarrhea relief and pepto bismal for stomach pain. Follow up with your PCP on Monday.  Ashley PattyAngela Braeley Buskey, DO PGY-2,  Family Medicine 10/10/2016 11:54 AM   Abdominal Pain, Adult Many things can cause belly (abdominal) pain. Most times, belly pain is not dangerous. Many cases of belly pain can be watched and treated at home. Sometimes belly pain is serious, though. Your doctor will try to find the cause of your belly pain. Follow these instructions at home:  Take over-the-counter and prescription medicines only as told by your doctor. Do not take medicines that help you poop (laxatives) unless told to by your doctor.  Drink enough fluid to keep your pee (urine) clear or pale yellow.  Watch your belly pain for any changes.  Keep all follow-up visits as told by your doctor. This is important. Contact a doctor if:  Your belly pain changes or gets worse.  You are not hungry, or you lose weight without trying.  You are having trouble pooping (constipated) or have watery poop (diarrhea) for more than 2-3 days.  You have pain when you pee or poop.  Your belly pain wakes you up at night.  Your pain gets worse with meals, after eating, or with certain foods.  You are throwing up and cannot keep anything down.  You have a fever. Get help right away if:  Your pain does not go away as soon as your doctor says it should.  You cannot stop throwing up.  Your pain is only in areas of your belly, such as the right side or the left lower part of the belly.  You have bloody or black poop, or poop that looks like tar.  You have very bad pain, cramping, or bloating in your belly.  You have signs of not having enough fluid or water in your body (dehydration), such as: ? Dark pee, very  little pee, or no pee. ? Cracked lips. ? Dry mouth. ? Sunken eyes. ? Sleepiness. ? Weakness. This information is not intended to replace advice given to you by your health care provider. Make sure you discuss any questions you have with your health care provider. Document Released: 08/13/2007 Document Revised: 09/14/2015 Document Reviewed: 08/08/2015 Elsevier Interactive Patient Education  2017 [employer]

## 2016-10-10 NOTE — Assessment & Plan Note (Addendum)
  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 No hernia appreciated on exam bilaterally. Pelvic exam with some CMT making PID a possibility. No ovarian masses appreciated. Some scant white discharge noted. Patient also endorsed some symptoms of viral gastroenteritis earlier this week.  -wet pep done today will follow up results w/ patient over the phone -GC/chlamydia sent, will follow up results -patient to follow up with PCP Monday for further work up -advised tylenol, heating pad for pain relief -immodium/pepto bismal recommended for diarrhea or upset stomach -work note given -return precautions given

## 2016-10-13 ENCOUNTER — Ambulatory Visit: Payer: Medicaid Other | Admitting: Internal Medicine

## 2016-10-13 LAB — CERVICOVAGINAL ANCILLARY ONLY
Chlamydia: NEGATIVE
NEISSERIA GONORRHEA: NEGATIVE

## 2016-10-14 ENCOUNTER — Telehealth: Payer: Self-pay

## 2016-10-14 NOTE — Telephone Encounter (Signed)
Attempted to contact pt to inform of negative std results. Pt did not answer and no option for VM. Please inform pt of negative std results if she calls back. Thanks!

## 2016-10-14 NOTE — Telephone Encounter (Signed)
-----   Message from Tillman SersAngela C Riccio, DO sent at 10/13/2016  4:36 PM EDT ----- Please let patient know her labs were negative for gonorrhea and chlamydia. Thank you.

## 2016-10-15 ENCOUNTER — Ambulatory Visit: Payer: Medicaid Other | Admitting: Internal Medicine

## 2016-11-28 ENCOUNTER — Telehealth: Payer: Self-pay | Admitting: Internal Medicine

## 2016-11-28 NOTE — Telephone Encounter (Signed)
LM for patient to call back.  Please inform patient that we are unable to email a copy of results to her since she doesn't have an active mychart.  Please find out the fax number this needs to go to and we can send the results directly to her employer. Ashley Jordan,CMA

## 2016-11-28 NOTE — Telephone Encounter (Signed)
Pt need a copy Tb test results but cannot come get it. She needs it today. Could it be emailed to her today before 5?   [email or web address]

## 2016-12-02 ENCOUNTER — Encounter: Payer: Self-pay | Admitting: *Deleted

## 2017-02-03 ENCOUNTER — Other Ambulatory Visit: Payer: Self-pay | Admitting: Internal Medicine

## 2017-02-03 MED ORDER — VALACYCLOVIR HCL 1 G PO TABS: 1000.0000 mg | ORAL_TABLET | Freq: Two times a day (BID) | ORAL | 0 refills | Status: DC

## 2017-02-03 NOTE — Telephone Encounter (Signed)
[facility] [address] Please advise

## 2017-07-31 ENCOUNTER — Other Ambulatory Visit: Payer: Self-pay | Admitting: Family Medicine

## 2017-07-31 DIAGNOSIS — F411 Generalized anxiety disorder: Secondary | ICD-10-CM

## 2017-08-12 ENCOUNTER — Ambulatory Visit: Payer: Self-pay | Admitting: Internal Medicine

## 2017-08-21 ENCOUNTER — Encounter: Payer: Medicaid Other | Admitting: Internal Medicine

## 2017-09-04 ENCOUNTER — Telehealth: Payer: Self-pay | Admitting: Licensed Clinical Social Worker
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
## 2017-09-09 ENCOUNTER — Other Ambulatory Visit: Payer: Self-pay

## 2017-09-09 MED ORDER — VALACYCLOVIR HCL 1 G PO TABS
1000.0000 mg | ORAL_TABLET | Freq: Two times a day (BID) | ORAL | 0 refills | Status: AC
Start: 2017-09-09 — End: ?

## 2017-09-09 NOTE — Telephone Encounter (Signed)
Patient called for refill of Valtrex. Would like before holiday if possible.   Call back is [phone]  Ples SpecterAlisa Jiro Kiester, RN [facility] RN)

## 2017-10-20 ENCOUNTER — Encounter: Payer: Self-pay | Admitting: Pediatric Intensive Care

## 2017-11-05 DIAGNOSIS — Z23 Encounter for immunization: Secondary | ICD-10-CM | POA: Diagnosis not present

## 2017-11-16 NOTE — Congregational Nurse Program (Signed)
Client states issues with ankle pain. Client is having some ankle swelling- probably dependent edema r/t sleeping in chair in lobby. BP WNL. CN advised appointment with PCP for evaluation.

## 2018-10-16 DIAGNOSIS — K5900 Constipation, unspecified: Secondary | ICD-10-CM | POA: Diagnosis not present

## 2018-10-26 DIAGNOSIS — Y9269 Other specified industrial and construction area as the place of occurrence of the external cause: Secondary | ICD-10-CM | POA: Diagnosis not present

## 2018-10-26 DIAGNOSIS — Y990 Civilian activity done for income or pay: Secondary | ICD-10-CM | POA: Diagnosis not present

## 2018-10-26 DIAGNOSIS — S6992XA Unspecified injury of left wrist, hand and finger(s), initial encounter: Secondary | ICD-10-CM | POA: Diagnosis not present

## 2018-10-26 DIAGNOSIS — W230XXA Caught, crushed, jammed, or pinched between moving objects, initial encounter: Secondary | ICD-10-CM | POA: Diagnosis not present

## 2019-01-18 DIAGNOSIS — N10 Acute tubulo-interstitial nephritis: Secondary | ICD-10-CM | POA: Diagnosis not present

## 2019-01-28 ENCOUNTER — Encounter: Payer: Medicaid Other | Admitting: Family Medicine

## 2020-04-05 DIAGNOSIS — N390 Urinary tract infection, site not specified: Secondary | ICD-10-CM | POA: Diagnosis not present

## 2020-04-05 DIAGNOSIS — X58XXXA Exposure to other specified factors, initial encounter: Secondary | ICD-10-CM | POA: Diagnosis not present

## 2020-04-05 DIAGNOSIS — S335XXA Sprain of ligaments of lumbar spine, initial encounter: Secondary | ICD-10-CM | POA: Diagnosis not present

## 2020-04-05 DIAGNOSIS — J45909 Unspecified asthma, uncomplicated: Secondary | ICD-10-CM | POA: Diagnosis not present

## 2020-04-05 DIAGNOSIS — F419 Anxiety disorder, unspecified: Secondary | ICD-10-CM | POA: Diagnosis not present

## 2020-10-02 DIAGNOSIS — Z0130 Encounter for examination of blood pressure without abnormal findings: Secondary | ICD-10-CM | POA: Diagnosis not present

## 2021-06-24 DIAGNOSIS — F321 Major depressive disorder, single episode, moderate: Secondary | ICD-10-CM | POA: Diagnosis not present

## 2021-06-25 DIAGNOSIS — F321 Major depressive disorder, single episode, moderate: Secondary | ICD-10-CM | POA: Diagnosis not present

## 2021-06-26 DIAGNOSIS — F321 Major depressive disorder, single episode, moderate: Secondary | ICD-10-CM | POA: Diagnosis not present

## 2021-06-27 DIAGNOSIS — F321 Major depressive disorder, single episode, moderate: Secondary | ICD-10-CM | POA: Diagnosis not present

## 2021-06-28 DIAGNOSIS — F321 Major depressive disorder, single episode, moderate: Secondary | ICD-10-CM | POA: Diagnosis not present

## 2021-06-29 DIAGNOSIS — F321 Major depressive disorder, single episode, moderate: Secondary | ICD-10-CM | POA: Diagnosis not present

## 2021-06-30 DIAGNOSIS — F321 Major depressive disorder, single episode, moderate: Secondary | ICD-10-CM | POA: Diagnosis not present

## 2021-07-02 DIAGNOSIS — F321 Major depressive disorder, single episode, moderate: Secondary | ICD-10-CM | POA: Diagnosis not present

## 2021-07-03 DIAGNOSIS — F321 Major depressive disorder, single episode, moderate: Secondary | ICD-10-CM | POA: Diagnosis not present

## 2021-07-04 DIAGNOSIS — F321 Major depressive disorder, single episode, moderate: Secondary | ICD-10-CM | POA: Diagnosis not present

## 2021-07-05 ENCOUNTER — Ambulatory Visit: Payer: Medicaid Other | Admitting: Student

## 2021-07-05 DIAGNOSIS — F321 Major depressive disorder, single episode, moderate: Secondary | ICD-10-CM | POA: Diagnosis not present

## 2021-07-06 DIAGNOSIS — F321 Major depressive disorder, single episode, moderate: Secondary | ICD-10-CM | POA: Diagnosis not present

## 2021-07-08 DIAGNOSIS — F321 Major depressive disorder, single episode, moderate: Secondary | ICD-10-CM | POA: Diagnosis not present

## 2021-07-09 DIAGNOSIS — F321 Major depressive disorder, single episode, moderate: Secondary | ICD-10-CM | POA: Diagnosis not present

## 2021-07-10 DIAGNOSIS — F321 Major depressive disorder, single episode, moderate: Secondary | ICD-10-CM | POA: Diagnosis not present

## 2021-07-11 DIAGNOSIS — F321 Major depressive disorder, single episode, moderate: Secondary | ICD-10-CM | POA: Diagnosis not present

## 2021-07-13 DIAGNOSIS — F321 Major depressive disorder, single episode, moderate: Secondary | ICD-10-CM | POA: Diagnosis not present

## 2021-07-15 DIAGNOSIS — F321 Major depressive disorder, single episode, moderate: Secondary | ICD-10-CM | POA: Diagnosis not present

## 2021-07-16 DIAGNOSIS — J45909 Unspecified asthma, uncomplicated: Secondary | ICD-10-CM | POA: Diagnosis not present

## 2021-07-16 DIAGNOSIS — R509 Fever, unspecified: Secondary | ICD-10-CM | POA: Diagnosis not present

## 2021-07-16 DIAGNOSIS — F321 Major depressive disorder, single episode, moderate: Secondary | ICD-10-CM | POA: Diagnosis not present

## 2021-07-16 DIAGNOSIS — J329 Chronic sinusitis, unspecified: Secondary | ICD-10-CM | POA: Diagnosis not present

## 2021-07-16 DIAGNOSIS — R059 Cough, unspecified: Secondary | ICD-10-CM | POA: Diagnosis not present

## 2021-07-19 DIAGNOSIS — F321 Major depressive disorder, single episode, moderate: Secondary | ICD-10-CM | POA: Diagnosis not present

## 2021-07-20 DIAGNOSIS — F321 Major depressive disorder, single episode, moderate: Secondary | ICD-10-CM | POA: Diagnosis not present

## 2021-07-21 DIAGNOSIS — F321 Major depressive disorder, single episode, moderate: Secondary | ICD-10-CM | POA: Diagnosis not present

## 2021-07-22 DIAGNOSIS — F321 Major depressive disorder, single episode, moderate: Secondary | ICD-10-CM | POA: Diagnosis not present

## 2021-07-23 DIAGNOSIS — F321 Major depressive disorder, single episode, moderate: Secondary | ICD-10-CM | POA: Diagnosis not present

## 2021-07-24 DIAGNOSIS — F321 Major depressive disorder, single episode, moderate: Secondary | ICD-10-CM | POA: Diagnosis not present

## 2021-07-25 DIAGNOSIS — F321 Major depressive disorder, single episode, moderate: Secondary | ICD-10-CM | POA: Diagnosis not present

## 2021-07-26 ENCOUNTER — Ambulatory Visit: Payer: Medicaid Other | Admitting: Student

## 2021-07-26 DIAGNOSIS — F321 Major depressive disorder, single episode, moderate: Secondary | ICD-10-CM | POA: Diagnosis not present

## 2021-07-30 DIAGNOSIS — F321 Major depressive disorder, single episode, moderate: Secondary | ICD-10-CM | POA: Diagnosis not present

## 2021-08-01 DIAGNOSIS — F321 Major depressive disorder, single episode, moderate: Secondary | ICD-10-CM | POA: Diagnosis not present

## 2021-08-02 DIAGNOSIS — F321 Major depressive disorder, single episode, moderate: Secondary | ICD-10-CM | POA: Diagnosis not present

## 2021-08-03 DIAGNOSIS — F321 Major depressive disorder, single episode, moderate: Secondary | ICD-10-CM | POA: Diagnosis not present

## 2021-08-04 DIAGNOSIS — F321 Major depressive disorder, single episode, moderate: Secondary | ICD-10-CM | POA: Diagnosis not present

## 2021-08-06 DIAGNOSIS — F321 Major depressive disorder, single episode, moderate: Secondary | ICD-10-CM | POA: Diagnosis not present

## 2021-08-07 DIAGNOSIS — F321 Major depressive disorder, single episode, moderate: Secondary | ICD-10-CM | POA: Diagnosis not present

## 2021-08-08 DIAGNOSIS — F321 Major depressive disorder, single episode, moderate: Secondary | ICD-10-CM | POA: Diagnosis not present

## 2021-08-09 DIAGNOSIS — F321 Major depressive disorder, single episode, moderate: Secondary | ICD-10-CM | POA: Diagnosis not present

## 2021-08-13 DIAGNOSIS — F321 Major depressive disorder, single episode, moderate: Secondary | ICD-10-CM | POA: Diagnosis not present

## 2021-08-15 DIAGNOSIS — F321 Major depressive disorder, single episode, moderate: Secondary | ICD-10-CM | POA: Diagnosis not present

## 2021-08-16 DIAGNOSIS — F321 Major depressive disorder, single episode, moderate: Secondary | ICD-10-CM | POA: Diagnosis not present

## 2021-08-17 DIAGNOSIS — F321 Major depressive disorder, single episode, moderate: Secondary | ICD-10-CM | POA: Diagnosis not present

## 2021-08-18 DIAGNOSIS — F321 Major depressive disorder, single episode, moderate: Secondary | ICD-10-CM | POA: Diagnosis not present

## 2021-08-19 DIAGNOSIS — F321 Major depressive disorder, single episode, moderate: Secondary | ICD-10-CM | POA: Diagnosis not present

## 2021-08-20 DIAGNOSIS — F321 Major depressive disorder, single episode, moderate: Secondary | ICD-10-CM | POA: Diagnosis not present

## 2021-08-21 DIAGNOSIS — F321 Major depressive disorder, single episode, moderate: Secondary | ICD-10-CM | POA: Diagnosis not present

## 2021-08-22 DIAGNOSIS — F321 Major depressive disorder, single episode, moderate: Secondary | ICD-10-CM | POA: Diagnosis not present

## 2021-08-23 DIAGNOSIS — F321 Major depressive disorder, single episode, moderate: Secondary | ICD-10-CM | POA: Diagnosis not present

## 2021-08-25 DIAGNOSIS — F321 Major depressive disorder, single episode, moderate: Secondary | ICD-10-CM | POA: Diagnosis not present

## 2021-08-26 DIAGNOSIS — F321 Major depressive disorder, single episode, moderate: Secondary | ICD-10-CM | POA: Diagnosis not present

## 2021-08-27 DIAGNOSIS — F321 Major depressive disorder, single episode, moderate: Secondary | ICD-10-CM | POA: Diagnosis not present

## 2021-08-28 DIAGNOSIS — F321 Major depressive disorder, single episode, moderate: Secondary | ICD-10-CM | POA: Diagnosis not present

## 2021-08-29 DIAGNOSIS — F321 Major depressive disorder, single episode, moderate: Secondary | ICD-10-CM | POA: Diagnosis not present

## 2021-08-30 DIAGNOSIS — F321 Major depressive disorder, single episode, moderate: Secondary | ICD-10-CM | POA: Diagnosis not present

## 2021-09-01 DIAGNOSIS — F321 Major depressive disorder, single episode, moderate: Secondary | ICD-10-CM | POA: Diagnosis not present

## 2021-09-02 DIAGNOSIS — F321 Major depressive disorder, single episode, moderate: Secondary | ICD-10-CM | POA: Diagnosis not present

## 2021-09-03 DIAGNOSIS — F321 Major depressive disorder, single episode, moderate: Secondary | ICD-10-CM | POA: Diagnosis not present

## 2021-09-04 DIAGNOSIS — F321 Major depressive disorder, single episode, moderate: Secondary | ICD-10-CM | POA: Diagnosis not present

## 2021-09-05 DIAGNOSIS — F321 Major depressive disorder, single episode, moderate: Secondary | ICD-10-CM | POA: Diagnosis not present

## 2021-09-06 DIAGNOSIS — F321 Major depressive disorder, single episode, moderate: Secondary | ICD-10-CM | POA: Diagnosis not present

## 2021-09-08 DIAGNOSIS — F321 Major depressive disorder, single episode, moderate: Secondary | ICD-10-CM | POA: Diagnosis not present

## 2021-09-09 DIAGNOSIS — F321 Major depressive disorder, single episode, moderate: Secondary | ICD-10-CM | POA: Diagnosis not present

## 2021-09-10 DIAGNOSIS — F321 Major depressive disorder, single episode, moderate: Secondary | ICD-10-CM | POA: Diagnosis not present

## 2021-09-11 DIAGNOSIS — F321 Major depressive disorder, single episode, moderate: Secondary | ICD-10-CM | POA: Diagnosis not present

## 2021-09-12 DIAGNOSIS — F321 Major depressive disorder, single episode, moderate: Secondary | ICD-10-CM | POA: Diagnosis not present

## 2021-09-13 DIAGNOSIS — F321 Major depressive disorder, single episode, moderate: Secondary | ICD-10-CM | POA: Diagnosis not present

## 2021-09-15 DIAGNOSIS — F321 Major depressive disorder, single episode, moderate: Secondary | ICD-10-CM | POA: Diagnosis not present

## 2021-09-16 DIAGNOSIS — F321 Major depressive disorder, single episode, moderate: Secondary | ICD-10-CM | POA: Diagnosis not present

## 2021-09-17 DIAGNOSIS — F321 Major depressive disorder, single episode, moderate: Secondary | ICD-10-CM | POA: Diagnosis not present

## 2021-09-18 DIAGNOSIS — F321 Major depressive disorder, single episode, moderate: Secondary | ICD-10-CM | POA: Diagnosis not present

## 2021-09-19 DIAGNOSIS — F321 Major depressive disorder, single episode, moderate: Secondary | ICD-10-CM | POA: Diagnosis not present

## 2021-09-20 DIAGNOSIS — F321 Major depressive disorder, single episode, moderate: Secondary | ICD-10-CM | POA: Diagnosis not present

## 2021-09-22 DIAGNOSIS — F321 Major depressive disorder, single episode, moderate: Secondary | ICD-10-CM | POA: Diagnosis not present

## 2021-09-23 DIAGNOSIS — F321 Major depressive disorder, single episode, moderate: Secondary | ICD-10-CM | POA: Diagnosis not present

## 2021-09-24 DIAGNOSIS — F321 Major depressive disorder, single episode, moderate: Secondary | ICD-10-CM | POA: Diagnosis not present

## 2021-09-25 DIAGNOSIS — F321 Major depressive disorder, single episode, moderate: Secondary | ICD-10-CM | POA: Diagnosis not present

## 2021-09-26 DIAGNOSIS — F321 Major depressive disorder, single episode, moderate: Secondary | ICD-10-CM | POA: Diagnosis not present

## 2021-09-27 DIAGNOSIS — F321 Major depressive disorder, single episode, moderate: Secondary | ICD-10-CM | POA: Diagnosis not present

## 2021-09-29 DIAGNOSIS — F321 Major depressive disorder, single episode, moderate: Secondary | ICD-10-CM | POA: Diagnosis not present

## 2021-09-30 DIAGNOSIS — F321 Major depressive disorder, single episode, moderate: Secondary | ICD-10-CM | POA: Diagnosis not present

## 2021-10-01 DIAGNOSIS — F321 Major depressive disorder, single episode, moderate: Secondary | ICD-10-CM | POA: Diagnosis not present

## 2021-10-02 DIAGNOSIS — F321 Major depressive disorder, single episode, moderate: Secondary | ICD-10-CM | POA: Diagnosis not present

## 2021-10-03 DIAGNOSIS — F321 Major depressive disorder, single episode, moderate: Secondary | ICD-10-CM | POA: Diagnosis not present

## 2021-10-04 DIAGNOSIS — F321 Major depressive disorder, single episode, moderate: Secondary | ICD-10-CM | POA: Diagnosis not present

## 2021-10-06 DIAGNOSIS — F321 Major depressive disorder, single episode, moderate: Secondary | ICD-10-CM | POA: Diagnosis not present

## 2021-10-07 DIAGNOSIS — F321 Major depressive disorder, single episode, moderate: Secondary | ICD-10-CM | POA: Diagnosis not present

## 2021-10-08 DIAGNOSIS — F321 Major depressive disorder, single episode, moderate: Secondary | ICD-10-CM | POA: Diagnosis not present

## 2021-10-09 DIAGNOSIS — F321 Major depressive disorder, single episode, moderate: Secondary | ICD-10-CM | POA: Diagnosis not present

## 2021-10-10 DIAGNOSIS — F321 Major depressive disorder, single episode, moderate: Secondary | ICD-10-CM | POA: Diagnosis not present

## 2021-10-11 DIAGNOSIS — F321 Major depressive disorder, single episode, moderate: Secondary | ICD-10-CM | POA: Diagnosis not present

## 2021-10-13 DIAGNOSIS — F321 Major depressive disorder, single episode, moderate: Secondary | ICD-10-CM | POA: Diagnosis not present

## 2021-10-14 DIAGNOSIS — F321 Major depressive disorder, single episode, moderate: Secondary | ICD-10-CM | POA: Diagnosis not present

## 2021-10-15 DIAGNOSIS — F321 Major depressive disorder, single episode, moderate: Secondary | ICD-10-CM | POA: Diagnosis not present

## 2021-10-16 DIAGNOSIS — F321 Major depressive disorder, single episode, moderate: Secondary | ICD-10-CM | POA: Diagnosis not present

## 2021-10-17 DIAGNOSIS — F321 Major depressive disorder, single episode, moderate: Secondary | ICD-10-CM | POA: Diagnosis not present

## 2021-10-18 DIAGNOSIS — F321 Major depressive disorder, single episode, moderate: Secondary | ICD-10-CM | POA: Diagnosis not present

## 2021-10-21 DIAGNOSIS — N390 Urinary tract infection, site not specified: Secondary | ICD-10-CM | POA: Diagnosis not present

## 2021-10-21 DIAGNOSIS — R42 Dizziness and giddiness: Secondary | ICD-10-CM | POA: Diagnosis not present

## 2021-10-21 DIAGNOSIS — A084 Viral intestinal infection, unspecified: Secondary | ICD-10-CM | POA: Diagnosis not present

## 2021-10-21 DIAGNOSIS — R350 Frequency of micturition: Secondary | ICD-10-CM | POA: Diagnosis not present

## 2021-10-21 DIAGNOSIS — R112 Nausea with vomiting, unspecified: Secondary | ICD-10-CM | POA: Diagnosis not present

## 2021-10-21 DIAGNOSIS — H6692 Otitis media, unspecified, left ear: Secondary | ICD-10-CM | POA: Diagnosis not present

## 2021-10-28 DIAGNOSIS — F321 Major depressive disorder, single episode, moderate: Secondary | ICD-10-CM | POA: Diagnosis not present

## 2021-10-29 DIAGNOSIS — F321 Major depressive disorder, single episode, moderate: Secondary | ICD-10-CM | POA: Diagnosis not present

## 2021-10-30 DIAGNOSIS — F321 Major depressive disorder, single episode, moderate: Secondary | ICD-10-CM | POA: Diagnosis not present

## 2021-10-31 DIAGNOSIS — F321 Major depressive disorder, single episode, moderate: Secondary | ICD-10-CM | POA: Diagnosis not present

## 2021-11-01 DIAGNOSIS — F321 Major depressive disorder, single episode, moderate: Secondary | ICD-10-CM | POA: Diagnosis not present

## 2021-11-03 DIAGNOSIS — F321 Major depressive disorder, single episode, moderate: Secondary | ICD-10-CM | POA: Diagnosis not present

## 2021-11-04 DIAGNOSIS — F321 Major depressive disorder, single episode, moderate: Secondary | ICD-10-CM | POA: Diagnosis not present

## 2021-11-05 DIAGNOSIS — F321 Major depressive disorder, single episode, moderate: Secondary | ICD-10-CM | POA: Diagnosis not present

## 2021-11-06 DIAGNOSIS — F321 Major depressive disorder, single episode, moderate: Secondary | ICD-10-CM | POA: Diagnosis not present

## 2021-11-07 DIAGNOSIS — F321 Major depressive disorder, single episode, moderate: Secondary | ICD-10-CM | POA: Diagnosis not present

## 2021-11-08 DIAGNOSIS — F321 Major depressive disorder, single episode, moderate: Secondary | ICD-10-CM | POA: Diagnosis not present

## 2021-11-10 DIAGNOSIS — F321 Major depressive disorder, single episode, moderate: Secondary | ICD-10-CM | POA: Diagnosis not present

## 2021-11-11 DIAGNOSIS — F321 Major depressive disorder, single episode, moderate: Secondary | ICD-10-CM | POA: Diagnosis not present

## 2022-06-23 ENCOUNTER — Telehealth: Payer: Self-pay

## 2022-06-23 NOTE — Telephone Encounter (Signed)
Attempted to contact patient to schedule apt with PCP. Neither # in chart working. AS, CMA

## 2022-08-20 DIAGNOSIS — R109 Unspecified abdominal pain: Secondary | ICD-10-CM | POA: Diagnosis not present
